# Patient Record
Sex: Female | Born: 1935
Health system: Southern US, Community
[De-identification: ages and names within clinical notes are randomized; demographics above are authoritative.]

## PROBLEM LIST (undated history)

## (undated) DIAGNOSIS — M1990 Unspecified osteoarthritis, unspecified site: Secondary | ICD-10-CM

## (undated) DIAGNOSIS — R112 Nausea with vomiting, unspecified: Secondary | ICD-10-CM

## (undated) DIAGNOSIS — R079 Chest pain, unspecified: Secondary | ICD-10-CM

## (undated) DIAGNOSIS — E785 Hyperlipidemia, unspecified: Secondary | ICD-10-CM

## (undated) DIAGNOSIS — M810 Age-related osteoporosis without current pathological fracture: Secondary | ICD-10-CM

## (undated) DIAGNOSIS — R42 Dizziness and giddiness: Secondary | ICD-10-CM

## (undated) DIAGNOSIS — N390 Urinary tract infection, site not specified: Secondary | ICD-10-CM

## (undated) DIAGNOSIS — A0472 Enterocolitis due to Clostridium difficile, not specified as recurrent: Secondary | ICD-10-CM

## (undated) DIAGNOSIS — I10 Essential (primary) hypertension: Secondary | ICD-10-CM

## (undated) DIAGNOSIS — J189 Pneumonia, unspecified organism: Secondary | ICD-10-CM

## (undated) DIAGNOSIS — Z9889 Other specified postprocedural states: Secondary | ICD-10-CM

## (undated) DIAGNOSIS — K219 Gastro-esophageal reflux disease without esophagitis: Secondary | ICD-10-CM

## (undated) DIAGNOSIS — K760 Fatty (change of) liver, not elsewhere classified: Secondary | ICD-10-CM

## (undated) HISTORY — PX: BREAST LUMPECTOMY: SHX2

## (undated) HISTORY — PX: COLONOSCOPY W/ POLYPECTOMY: SHX1380

## (undated) HISTORY — PX: EYE SURGERY: SHX253

## (undated) HISTORY — DX: Essential (primary) hypertension: I10

## (undated) HISTORY — PX: VESICOVAGINAL FISTULA CLOSURE W/ TAH: SUR271

## (undated) HISTORY — DX: Chest pain, unspecified: R07.9

## (undated) HISTORY — DX: Gastro-esophageal reflux disease without esophagitis: K21.9

## (undated) HISTORY — DX: Dizziness and giddiness: R42

## (undated) HISTORY — PX: OTHER SURGICAL HISTORY: SHX169

## (undated) HISTORY — DX: Age-related osteoporosis without current pathological fracture: M81.0

## (undated) HISTORY — DX: Hyperlipidemia, unspecified: E78.5

## (undated) HISTORY — DX: Fatty (change of) liver, not elsewhere classified: K76.0

---

## 2013-06-20 DIAGNOSIS — R079 Chest pain, unspecified: Secondary | ICD-10-CM

## 2013-06-20 HISTORY — DX: Chest pain, unspecified: R07.9

## 2014-06-20 DIAGNOSIS — J189 Pneumonia, unspecified organism: Secondary | ICD-10-CM

## 2014-06-20 HISTORY — DX: Pneumonia, unspecified organism: J18.9

## 2014-07-07 DIAGNOSIS — Z1231 Encounter for screening mammogram for malignant neoplasm of breast: Secondary | ICD-10-CM | POA: Diagnosis not present

## 2014-07-10 DIAGNOSIS — I10 Essential (primary) hypertension: Secondary | ICD-10-CM | POA: Diagnosis not present

## 2014-07-14 DIAGNOSIS — H2512 Age-related nuclear cataract, left eye: Secondary | ICD-10-CM | POA: Diagnosis not present

## 2014-07-14 DIAGNOSIS — I10 Essential (primary) hypertension: Secondary | ICD-10-CM | POA: Diagnosis not present

## 2014-07-14 DIAGNOSIS — Z961 Presence of intraocular lens: Secondary | ICD-10-CM | POA: Diagnosis not present

## 2014-07-14 DIAGNOSIS — H52223 Regular astigmatism, bilateral: Secondary | ICD-10-CM | POA: Diagnosis not present

## 2014-07-14 DIAGNOSIS — H25812 Combined forms of age-related cataract, left eye: Secondary | ICD-10-CM | POA: Diagnosis not present

## 2014-07-14 DIAGNOSIS — H5203 Hypermetropia, bilateral: Secondary | ICD-10-CM | POA: Diagnosis not present

## 2014-07-28 DIAGNOSIS — J90 Pleural effusion, not elsewhere classified: Secondary | ICD-10-CM | POA: Diagnosis not present

## 2014-07-28 DIAGNOSIS — J449 Chronic obstructive pulmonary disease, unspecified: Secondary | ICD-10-CM | POA: Diagnosis not present

## 2014-07-28 DIAGNOSIS — R079 Chest pain, unspecified: Secondary | ICD-10-CM | POA: Diagnosis not present

## 2014-08-05 DIAGNOSIS — R938 Abnormal findings on diagnostic imaging of other specified body structures: Secondary | ICD-10-CM | POA: Diagnosis not present

## 2014-08-05 DIAGNOSIS — R918 Other nonspecific abnormal finding of lung field: Secondary | ICD-10-CM | POA: Diagnosis not present

## 2014-08-05 DIAGNOSIS — I517 Cardiomegaly: Secondary | ICD-10-CM | POA: Diagnosis not present

## 2014-08-08 DIAGNOSIS — R938 Abnormal findings on diagnostic imaging of other specified body structures: Secondary | ICD-10-CM | POA: Diagnosis not present

## 2014-09-30 ENCOUNTER — Ambulatory Visit (INDEPENDENT_AMBULATORY_CARE_PROVIDER_SITE_OTHER): Payer: Self-pay | Admitting: Critical Care Medicine

## 2014-09-30 ENCOUNTER — Encounter: Payer: Self-pay | Admitting: *Deleted

## 2014-09-30 VITALS — BP 124/62 | HR 56 | Temp 98.8°F | Ht 61.0 in | Wt 104.0 lb

## 2014-09-30 DIAGNOSIS — R9389 Abnormal findings on diagnostic imaging of other specified body structures: Secondary | ICD-10-CM

## 2014-09-30 DIAGNOSIS — R938 Abnormal findings on diagnostic imaging of other specified body structures: Secondary | ICD-10-CM | POA: Diagnosis not present

## 2014-09-30 DIAGNOSIS — M810 Age-related osteoporosis without current pathological fracture: Secondary | ICD-10-CM | POA: Insufficient documentation

## 2014-09-30 DIAGNOSIS — J4540 Moderate persistent asthma, uncomplicated: Secondary | ICD-10-CM | POA: Diagnosis not present

## 2014-09-30 DIAGNOSIS — I7090 Unspecified atherosclerosis: Secondary | ICD-10-CM | POA: Diagnosis not present

## 2014-09-30 DIAGNOSIS — R918 Other nonspecific abnormal finding of lung field: Secondary | ICD-10-CM | POA: Diagnosis not present

## 2014-09-30 DIAGNOSIS — E785 Hyperlipidemia, unspecified: Secondary | ICD-10-CM | POA: Insufficient documentation

## 2014-09-30 DIAGNOSIS — K219 Gastro-esophageal reflux disease without esophagitis: Secondary | ICD-10-CM | POA: Insufficient documentation

## 2014-09-30 NOTE — Progress Notes (Signed)
Subjective:    Patient ID: Maria Garrett, female    DOB: 06/17/1935, 80 y.o.   MRN: [id number]  HPI Comments: Abn CT Chest ,   Pt had PNA and not cleared up:  05/2014.   CT scan 07/2014: polypoid mass 6mm in LMS.  Pt did have a lot of mucus.  Now will cough a lot.  No real chest pain.  No real wheeze.  No real dyspnea now. Symptoms are better.  Occ cough.  Now no mucus, previously had mucus 3 weeks ago  Cough This is a new problem. The current episode started more than 1 month ago. The problem has been rapidly improving. The cough is non-productive. Associated symptoms include postnasal drip. Pertinent negatives include no chest pain, chills, ear congestion, ear pain, fever, headaches, heartburn, hemoptysis, myalgias, nasal congestion, rash, rhinorrhea, sore throat, shortness of breath, sweats, weight loss or wheezing. The symptoms are aggravated by pollens. Risk factors: never smoker. Her past medical history is significant for asthma, bronchitis, environmental allergies and pneumonia. There is no history of bronchiectasis, COPD or emphysema. freq pna x 8 every fall   Past Medical History  Diagnosis Date  . Esophageal reflux   . Hypertension   . Vertigo   . Osteoporosis     T score 3.7  . Fatty liver   . Dyslipidemia   . Chest pain 2015     neg stress myoview 2015     Family History  Problem Relation Age of Onset  . Emphysema Mother   . Heart attack Father      History   Social History  . Marital Status: N/A    Spouse Name: N/A  . Number of Children: N/A  . Years of Education: N/A   Occupational History  . Retired     Loss adjuster, charteredDirector of Daycare   Social History Main Topics  . Smoking status: Never Smoker   . Smokeless tobacco: Never Used  . Alcohol Use: No  . Drug Use: No  . Sexual Activity: Not on file   Other Topics Concern  . Not on file   Social History Narrative  . No narrative on file     Allergies  Allergen Reactions  . Penicillins     Rash, swelling  .  Sulfa Antibiotics     Rash, swelling     No outpatient prescriptions prior to visit.   No facility-administered medications prior to visit.       Review of Systems  Constitutional: Negative for fever, chills and weight loss.  HENT: Positive for postnasal drip. Negative for ear pain, rhinorrhea, sore throat and trouble swallowing.   Respiratory: Positive for cough. Negative for hemoptysis, shortness of breath and wheezing.   Cardiovascular: Negative for chest pain.  Gastrointestinal: Negative for heartburn.       GERD   Musculoskeletal: Negative for myalgias.  Skin: Negative for rash.  Allergic/Immunologic: Positive for environmental allergies.  Neurological: Negative for headaches.       Objective:   Physical Exam Filed Vitals:   09/30/14 1154  BP: 124/62  Pulse: 56  Temp: 98.8 F (37.1 C)  TempSrc: Oral  Height: 5\' 1"  (1.549 m)  Weight: 104 lb (47.174 kg)  SpO2: 98%    Gen: Pleasant, well-nourished, in no distress,  normal affect  ENT: No lesions,  mouth clear,  oropharynx clear, no postnasal drip  Neck: No JVD, no TMG, no carotid bruits  Lungs: No use of accessory muscles, no dullness to percussion, clear  without rales or rhonchi  Cardiovascular: RRR, heart sounds normal, no murmur or gallops, no peripheral edema  Abdomen: soft and NT, no HSM,  BS normal  Musculoskeletal: No deformities, no cyanosis or clubbing  Neuro: alert, non focal  Skin: Warm, no lesions or rashes  No results found.   07/2014 CT Chest reviewed     Assessment & Plan:   Abnormal CT scan of lung Abn on CT chest suspect d/t mucus. Doubt CA,  Plan Repeat CT chest, if still present needs FOB PFTs No med changes for now     Updated Medication List Outpatient Encounter Prescriptions as of 09/30/2014  Medication Sig  . amLODipine (NORVASC) 5 MG tablet Take 1 tablet by mouth daily.  Marland Kitchen BIOTIN PO Take 2 capsules by mouth daily.  . calcium carbonate (TUMS - DOSED IN MG  ELEMENTAL CALCIUM) 500 MG chewable tablet Chew 1 tablet by mouth as needed for indigestion or heartburn.  . losartan (COZAAR) 25 MG tablet Take 25 mg by mouth daily.  . nitroGLYCERIN (NITROSTAT) 0.4 MG SL tablet Place 0.4 mg under the tongue every 5 (five) minutes as needed for chest pain.  . promethazine-dextromethorphan (PROMETHAZINE-DM) 6.25-15 MG/5ML syrup Take 5 mLs by mouth every 6 (six) hours as needed for cough.  . traZODone (DESYREL) 50 MG tablet Take 50 mg by mouth at bedtime as needed for sleep.

## 2014-09-30 NOTE — Patient Instructions (Signed)
Repeat CT of chest will be obtained Lung function tests will be obtained Labs for immune testing will be obtained No medications changes as of now Return 1 month

## 2014-10-01 DIAGNOSIS — R918 Other nonspecific abnormal finding of lung field: Secondary | ICD-10-CM | POA: Insufficient documentation

## 2014-10-01 NOTE — Assessment & Plan Note (Signed)
Abn on CT chest suspect d/t mucus. Doubt CA,  Plan Repeat CT chest, if still present needs FOB PFTs No med changes for now

## 2014-10-02 ENCOUNTER — Telehealth: Payer: Self-pay | Admitting: Critical Care Medicine

## 2014-10-02 DIAGNOSIS — J4540 Moderate persistent asthma, uncomplicated: Secondary | ICD-10-CM | POA: Diagnosis not present

## 2014-10-02 NOTE — Telephone Encounter (Signed)
Polyp still present Left main stem airway Will ask dr byrum for an FOB Pt aware

## 2014-10-28 ENCOUNTER — Telehealth: Payer: Self-pay | Admitting: Critical Care Medicine

## 2014-10-28 DIAGNOSIS — R918 Other nonspecific abnormal finding of lung field: Secondary | ICD-10-CM

## 2014-10-28 NOTE — Telephone Encounter (Signed)
Dr byrum has not yet called the pt to schedule the bronch Pt told results on pfts and labs  pls work with libby to schedule the bronch

## 2014-10-28 NOTE — Telephone Encounter (Signed)
Spoke with RB.  He will need to schedule reg bronch with no fluro; will need snares.   Lillia AbedLindsay will assist with scheduling to work with RB's schedule. Thank you.

## 2014-10-28 NOTE — Telephone Encounter (Signed)
RB wants to speak to a surgeon before he decides what kind of procedure to do. Will route message to CJ.

## 2014-10-28 NOTE — Telephone Encounter (Signed)
lmtcb x1 with respiratory to get this scheduled.  RB would like this scheduled on 10/30/14 before he goes in to 3pm Elink.

## 2014-10-30 ENCOUNTER — Ambulatory Visit (HOSPITAL_COMMUNITY): Admission: RE | Admit: 2014-10-30 | Payer: Self-pay | Source: Ambulatory Visit | Admitting: Emergency Medicine

## 2014-10-30 ENCOUNTER — Encounter (HOSPITAL_COMMUNITY): Admission: RE | Payer: Self-pay | Source: Ambulatory Visit

## 2014-10-30 ENCOUNTER — Encounter (HOSPITAL_COMMUNITY): Payer: Self-pay

## 2014-10-30 SURGERY — VIDEO BRONCHOSCOPY WITHOUT FLUORO
Anesthesia: Moderate Sedation | Laterality: Bilateral

## 2014-10-31 NOTE — Telephone Encounter (Signed)
We can check with Medical Records to see if they have report to scan in.  If not, can call [facility] for report.

## 2014-10-31 NOTE — Telephone Encounter (Signed)
I do not see a CT in pt chart and there is nothing scanned in. Checked Dr Florene RouteWrights scan folder and it is not in there either.  Not sure if they are in the basement in the process of being scanned into the chart? Dr Delford FieldWright notes that the patient had a CT done 07/2014 and will need a repeat - he had to have seen these results during office visit but there is nothing yet scanned in the chart.   Called pt to see if she has a copy of report - was this done at [facility].  [place]oke with Arline Aspindy [facility] aware that we are trying to track these results down.  Will send to Crystal as FYI that we are trying to locate results- if you come across these please call Arline AspCindy with [facility] back - these will need to be faxed to her @ (fax) [phone]

## 2014-10-31 NOTE — Telephone Encounter (Signed)
Spoke with Marcelino DusterMichelle at [facility] in medical records. She is going to fax the report over to front desk fax. Will await fax.

## 2014-10-31 NOTE — Telephone Encounter (Signed)
Cindy from [facility] calling for copy of abnormal CT, unable to find in [place], [phone]

## 2014-11-03 NOTE — Telephone Encounter (Signed)
Noted and thank you

## 2014-11-03 NOTE — Telephone Encounter (Signed)
CT Chest results from 09/30/14 done at [facility] received and given to RaymondLindsay.

## 2014-11-03 NOTE — Telephone Encounter (Signed)
Maria Garrett has been given the CT report. He has discussed this case with Dr. Joya GaskinsHendricksen. Pt has upcoming appointment with Dr. Joya GaskinsHendricksen. Maria Garrett and Dr. Joya GaskinsHendricksen are going to be doing the bronch together in the OR when the time comes. The bronch is going to be more complicated then Maria Garrett originally thought.

## 2014-11-04 ENCOUNTER — Ambulatory Visit: Payer: Self-pay | Admitting: Critical Care Medicine

## 2014-11-05 ENCOUNTER — Institutional Professional Consult (permissible substitution) (INDEPENDENT_AMBULATORY_CARE_PROVIDER_SITE_OTHER): Payer: Commercial Managed Care - HMO | Admitting: Thoracic Surgery (Cardiothoracic Vascular Surgery)

## 2014-11-05 ENCOUNTER — Encounter: Payer: Self-pay | Admitting: Thoracic Surgery (Cardiothoracic Vascular Surgery)

## 2014-11-05 VITALS — BP 183/84 | HR 64 | Resp 16 | Ht 61.0 in | Wt 104.0 lb

## 2014-11-05 DIAGNOSIS — R911 Solitary pulmonary nodule: Secondary | ICD-10-CM

## 2014-11-05 NOTE — Progress Notes (Signed)
PCP is PROCHNAU,CAROLINE, MD Referring Provider is Storm FriskWright, Patrick E, MD/ Levy PupaByrum, Robert, MD  Chief Complaint  Patient presents with  . Lung Lesion    CT CHEST @[place]   08/01/14 and 09/30/14    HPI: 80 yo woman sent for consultation re: a left main stem bronchial nodule.   Maria Garrett is a 80 yo lifelong non-smoker with a history of reflux, hypertension and vertigo. She had pneumonia in January. Her symptoms improved with antibiotics. But by mid February she was having a frequent cough with pleuritic CP. A CXR on 07/28/14 showed subsegmental atelectasis in the RLL. A chest CT was done on 08/05/14. It showed the area in question on the chest x-ray was epicardial fat. However, it did reveal a small polypoid nodule in the left main stem bronchus. There was a question as to whether this was just mucous. A repeat CT in April showed the same nodule.  She is a life-long nonsmoker. She denies CP or SOB outside of her acute illness earlier this year. Those have resolved. She does have reflux. No headaches or visual changes. Denies change in appetite, weight loss.  Zubrod Score: At the time of surgery this patient's most appropriate activity status/level should be described as: [x]     0    Normal activity, no symptoms []     1    Restricted in physical strenuous activity but ambulatory, able to do out light work []     2    Ambulatory and capable of self care, unable to do work activities, up and about >50 % of waking hours                              []     3    Only limited self care, in bed greater than 50% of waking hours []     4    Completely disabled, no self care, confined to bed or chair []     5    Moribund    Past Medical History  Diagnosis Date  . Esophageal reflux   . Hypertension   . Vertigo   . Osteoporosis     T score 3.7  . Fatty liver   . Dyslipidemia   . Chest pain 2015     neg stress myoview 2015    Past Surgical History  Procedure Laterality Date  . Vesicovaginal fistula  closure w/ tah    . Right arm surgery    . Left wrist surgery      Family History  Problem Relation Age of Onset  . Emphysema Mother   . Heart attack Father     Social History History  Substance Use Topics  . Smoking status: Never Smoker   . Smokeless tobacco: Never Used  . Alcohol Use: No    Current Outpatient Prescriptions  Medication Sig Dispense Refill  . amLODipine (NORVASC) 5 MG tablet Take 1 tablet by mouth daily.    Marland Kitchen. BIOTIN PO Take 2 capsules by mouth daily.    . calcium carbonate (TUMS - DOSED IN MG ELEMENTAL CALCIUM) 500 MG chewable tablet Chew 1 tablet by mouth as needed for indigestion or heartburn.    . losartan (COZAAR) 25 MG tablet Take 25 mg by mouth daily.    . nitroGLYCERIN (NITROSTAT) 0.4 MG SL tablet Place 0.4 mg under the tongue every 5 (five) minutes as needed for chest pain.    . promethazine-dextromethorphan (PROMETHAZINE-DM) 6.25-15 MG/5ML syrup Take  5 mLs by mouth every 6 (six) hours as needed for cough.    . traZODone (DESYREL) 50 MG tablet Take 50 mg by mouth at bedtime as needed for sleep.     No current facility-administered medications for this visit.    Allergies  Allergen Reactions  . Penicillins     Rash, swelling  . Sulfa Antibiotics     Rash, swelling    Review of Systems  Constitutional: Negative for fever, chills, activity change, appetite change, fatigue and unexpected weight change.  HENT: Positive for hearing loss.   Respiratory: Positive for cough.        Pleuritic pain in February  Cardiovascular: Negative for chest pain and leg swelling.  Gastrointestinal:       Reflux  Genitourinary: Negative.   Musculoskeletal: Positive for arthralgias.       Stiff neck  Hematological: Bruises/bleeds easily.  All other systems reviewed and are negative.   BP 183/84 mmHg  Pulse 64  Resp 16  Ht 5\' 1"  (1.549 m)  Wt 104 lb (47.174 kg)  BMI 19.66 kg/m2  SpO2 99% Physical Exam  Constitutional: She is oriented to person, place, and  time. She appears well-developed and well-nourished. No distress.  HENT:  Head: Normocephalic and atraumatic.  Eyes: EOM are normal. Pupils are equal, round, and reactive to light.  Neck: Neck supple. No thyromegaly present.  Cardiovascular: Normal rate, regular rhythm, normal heart sounds and intact distal pulses.  Exam reveals no gallop and no friction rub.   No murmur heard. Pulmonary/Chest: Effort normal and breath sounds normal. She has no wheezes. She has no rales.  Abdominal: Soft. She exhibits no mass. There is no tenderness.  Musculoskeletal: Normal range of motion. She exhibits no edema.  Lymphadenopathy:    She has no cervical adenopathy.  Neurological: She is alert and oriented to person, place, and time. No cranial nerve deficit.  No focal motor deficits  Skin: Skin is warm and dry.  Psychiatric: She has a normal mood and affect.  Vitals reviewed.    Diagnostic Tests: [facility] and 09/30/14 reviewed.   They show a 6 mm polypoid lesion in the distal left main stem bronchus. The nodule is anterior at the left upper lobe bronchus origin  Impression: 80 yo woman with a left main stem bronchial nodule. The differential includes both malignant and benign tumors. Carcinoid is the most likely diagnosis, but squamous cell carcinoma is a possibility. She does have second hand smoke exposure.  I recommended that we proceed with flexible fiberoptic bronchoscopy in the OR under general anesthesia to biopsy/ remove the mass. We would do this under general in case rigid bronch or laser is necessary. I described the procedure to Maria Garrett and her husband. I reviewed the general nature of the procedure, the need for general anesthesia, and that we would do this as an outpatient. They understand risks include, but are not limited to death, MI, stroke, bleeding, possible need for transfusion, airway perforation, pneumothorax, recurrence of the tumor.  She accepts  the risks and agrees to proceed.  Plan: Flexible fiberoptic bronchoscopy, possible laser, possible rigid bronchoscopy on Wed. 11/26/14  Maria SlotSteven C Stanly Si, MD [facility] [phone]

## 2014-11-06 ENCOUNTER — Other Ambulatory Visit: Payer: Self-pay | Admitting: *Deleted

## 2014-11-06 ENCOUNTER — Encounter: Payer: Self-pay | Admitting: Critical Care Medicine

## 2014-11-06 DIAGNOSIS — R911 Solitary pulmonary nodule: Secondary | ICD-10-CM

## 2014-11-19 NOTE — Pre-Procedure Instructions (Signed)
Gigi Gineggy D Kohen  11/19/2014    [facility] - [place], [place] - [address] [place] [address] Phone: [phone] Fax: [phone]  [facility] [address] [place], [place] - [id number] [place] [id number] Phone: [phone] Fax: [phone]    Your procedure is scheduled on: Wednesday November 26, 2014 at 8:30 AM.  [facility] at 6:30 A.M.  Call this number if you have problems the morning of surgery: [phone]    Remember:  Do not eat food or drink liquids after midnight.  Take these medicines the morning of surgery with A SIP OF WATER: Amlodipine (Norvasc)   Please stop taking any vitamins, herbal medications, biotin, Ibuprofen, Advil, Motrin, etc   Do not wear jewelry, make-up or nail polish.  Do not wear lotions, powders, or perfumes.  You may NOT wear deodorant.  Do not shave 48 hours prior to surgery.    Do not bring valuables to the hospital.  [facility] is not responsible for any belongings or valuables.  Contacts, dentures or bridgework may not be worn into surgery.  Leave your suitcase in the car.  After surgery it may be brought to your room.  For patients admitted to the hospital, discharge time will be determined by your treatment team.  Patients discharged the day of surgery will not be allowed to drive home.   Name and phone number of your driver:    Special instructions: Shower using CHG soap the night before and the morning of your surgery  Please read over the following fact sheets that you were given. Pain Booklet, Coughing and Deep Breathing and Surgical Site Infection Prevention

## 2014-11-20 ENCOUNTER — Encounter (HOSPITAL_COMMUNITY): Payer: Self-pay

## 2014-11-20 ENCOUNTER — Encounter (HOSPITAL_COMMUNITY)
Admission: RE | Admit: 2014-11-20 | Discharge: 2014-11-20 | Disposition: A | Discharge status: Home / Self Care | Payer: Commercial Managed Care - HMO | Source: Ambulatory Visit | Attending: Thoracic Surgery (Cardiothoracic Vascular Surgery) | Admitting: Thoracic Surgery (Cardiothoracic Vascular Surgery)

## 2014-11-20 ENCOUNTER — Other Ambulatory Visit: Payer: Self-pay

## 2014-11-20 VITALS — BP 138/55 | HR 58 | Temp 98.7°F | Resp 16 | Ht 61.0 in | Wt 106.3 lb

## 2014-11-20 DIAGNOSIS — J9809 Other diseases of bronchus, not elsewhere classified: Secondary | ICD-10-CM | POA: Diagnosis not present

## 2014-11-20 DIAGNOSIS — R911 Solitary pulmonary nodule: Secondary | ICD-10-CM

## 2014-11-20 DIAGNOSIS — Z01812 Encounter for preprocedural laboratory examination: Secondary | ICD-10-CM | POA: Diagnosis not present

## 2014-11-20 DIAGNOSIS — R001 Bradycardia, unspecified: Secondary | ICD-10-CM | POA: Diagnosis not present

## 2014-11-20 HISTORY — DX: Other specified postprocedural states: R11.2

## 2014-11-20 HISTORY — DX: Enterocolitis due to Clostridium difficile, not specified as recurrent: A04.72

## 2014-11-20 HISTORY — DX: Other specified postprocedural states: Z98.890

## 2014-11-20 HISTORY — DX: Unspecified osteoarthritis, unspecified site: M19.90

## 2014-11-20 HISTORY — DX: Pneumonia, unspecified organism: J18.9

## 2014-11-20 HISTORY — DX: Urinary tract infection, site not specified: N39.0

## 2014-11-20 LAB — COMPREHENSIVE METABOLIC PANEL
ALK PHOS: 55 U/L (ref 38–126)
ALT: 22 U/L (ref 14–54)
AST: 25 U/L (ref 15–41)
Albumin: 4.1 g/dL (ref 3.5–5.0)
Anion gap: 9 (ref 5–15)
BUN: 10 mg/dL (ref 6–20)
CALCIUM: 9.4 mg/dL (ref 8.9–10.3)
CO2: 27 mmol/L (ref 22–32)
CREATININE: 0.87 mg/dL (ref 0.44–1.00)
Chloride: 106 mmol/L (ref 101–111)
GLUCOSE: 93 mg/dL (ref 65–99)
Potassium: 3.8 mmol/L (ref 3.5–5.1)
SODIUM: 142 mmol/L (ref 135–145)
Total Bilirubin: 0.4 mg/dL (ref 0.3–1.2)
Total Protein: 7 g/dL (ref 6.5–8.1)

## 2014-11-20 LAB — CBC
HEMATOCRIT: 38.5 % (ref 36.0–46.0)
Hemoglobin: 13.3 g/dL (ref 12.0–15.0)
MCH: 30.2 pg (ref 26.0–34.0)
MCHC: 34.5 g/dL (ref 30.0–36.0)
MCV: 87.5 fL (ref 78.0–100.0)
Platelets: 253 10*3/uL (ref 150–400)
RBC: 4.4 MIL/uL (ref 3.87–5.11)
RDW: 12.2 % (ref 11.5–15.5)
WBC: 6.6 10*3/uL (ref 4.0–10.5)

## 2014-11-20 LAB — PROTIME-INR
INR: 1.08 (ref 0.00–1.49)
Prothrombin Time: 14.2 seconds (ref 11.6–15.2)

## 2014-11-20 LAB — APTT: aPTT: 29 seconds (ref 24–37)

## 2014-11-20 NOTE — Progress Notes (Signed)
PCP is Philemon Kingdomaroline Prochnau and Pulmonologist is Shan Levansatrick Wright. Patient denied having any acute cardiac or pulmonary issues. Patient informed Nurse that she had a stress test [facility] within the last five years. Will request records. Husband at chair side during PAT visit.

## 2014-11-24 ENCOUNTER — Encounter: Payer: Self-pay | Admitting: Critical Care Medicine

## 2014-11-25 ENCOUNTER — Ambulatory Visit: Payer: Self-pay | Admitting: Critical Care Medicine

## 2014-11-25 NOTE — Progress Notes (Signed)
Spoke with [facility] and they will fax us the stress test from 2012 today

## 2014-11-26 ENCOUNTER — Encounter (HOSPITAL_COMMUNITY): Payer: Self-pay | Admitting: *Deleted

## 2014-11-26 ENCOUNTER — Ambulatory Visit (HOSPITAL_COMMUNITY)
Admission: RE | Admit: 2014-11-26 | Discharge: 2014-11-26 | Disposition: A | Discharge status: Home / Self Care | Payer: Commercial Managed Care - HMO | Source: Ambulatory Visit | Attending: Thoracic Surgery (Cardiothoracic Vascular Surgery) | Admitting: Thoracic Surgery (Cardiothoracic Vascular Surgery)

## 2014-11-26 ENCOUNTER — Ambulatory Visit (HOSPITAL_COMMUNITY): Payer: Commercial Managed Care - HMO

## 2014-11-26 ENCOUNTER — Ambulatory Visit (HOSPITAL_COMMUNITY): Payer: Commercial Managed Care - HMO | Admitting: Anesthesiology

## 2014-11-26 ENCOUNTER — Encounter (HOSPITAL_COMMUNITY)
Admission: RE | Disposition: A | Discharge status: Home / Self Care | Payer: Self-pay | Source: Ambulatory Visit | Attending: Thoracic Surgery (Cardiothoracic Vascular Surgery)

## 2014-11-26 DIAGNOSIS — E785 Hyperlipidemia, unspecified: Secondary | ICD-10-CM | POA: Insufficient documentation

## 2014-11-26 DIAGNOSIS — K760 Fatty (change of) liver, not elsewhere classified: Secondary | ICD-10-CM | POA: Diagnosis not present

## 2014-11-26 DIAGNOSIS — I10 Essential (primary) hypertension: Secondary | ICD-10-CM | POA: Insufficient documentation

## 2014-11-26 DIAGNOSIS — M1990 Unspecified osteoarthritis, unspecified site: Secondary | ICD-10-CM | POA: Diagnosis not present

## 2014-11-26 DIAGNOSIS — Z880 Allergy status to penicillin: Secondary | ICD-10-CM | POA: Diagnosis not present

## 2014-11-26 DIAGNOSIS — J9809 Other diseases of bronchus, not elsewhere classified: Secondary | ICD-10-CM | POA: Diagnosis not present

## 2014-11-26 DIAGNOSIS — Z882 Allergy status to sulfonamides status: Secondary | ICD-10-CM | POA: Diagnosis not present

## 2014-11-26 DIAGNOSIS — R911 Solitary pulmonary nodule: Secondary | ICD-10-CM | POA: Insufficient documentation

## 2014-11-26 DIAGNOSIS — J449 Chronic obstructive pulmonary disease, unspecified: Secondary | ICD-10-CM | POA: Diagnosis not present

## 2014-11-26 DIAGNOSIS — K219 Gastro-esophageal reflux disease without esophagitis: Secondary | ICD-10-CM | POA: Insufficient documentation

## 2014-11-26 DIAGNOSIS — Z01811 Encounter for preprocedural respiratory examination: Secondary | ICD-10-CM

## 2014-11-26 DIAGNOSIS — M810 Age-related osteoporosis without current pathological fracture: Secondary | ICD-10-CM | POA: Diagnosis not present

## 2014-11-26 DIAGNOSIS — Z79899 Other long term (current) drug therapy: Secondary | ICD-10-CM | POA: Diagnosis not present

## 2014-11-26 HISTORY — PX: VIDEO BRONCHOSCOPY: SHX5072

## 2014-11-26 SURGERY — BRONCHOSCOPY, VIDEO-ASSISTED
Anesthesia: General | Site: Bronchus

## 2014-11-26 MED ORDER — DEXAMETHASONE SODIUM PHOSPHATE 10 MG/ML IJ SOLN
INTRAMUSCULAR | Status: DC | PRN
  Administered 2014-11-26: 4 mg via INTRAVENOUS

## 2014-11-26 MED ORDER — ROCURONIUM BROMIDE 50 MG/5ML IV SOLN
INTRAVENOUS | Status: AC
  Filled 2014-11-26: qty 1

## 2014-11-26 MED ORDER — PROPOFOL 10 MG/ML IV BOLUS
INTRAVENOUS | Status: DC | PRN
  Administered 2014-11-26: 120 mg via INTRAVENOUS

## 2014-11-26 MED ORDER — NEOSTIGMINE METHYLSULFATE 10 MG/10ML IV SOLN
INTRAVENOUS | Status: DC | PRN
  Administered 2014-11-26: 3 mg via INTRAVENOUS

## 2014-11-26 MED ORDER — EPINEPHRINE HCL 1 MG/ML IJ SOLN
INTRAMUSCULAR | Status: DC | PRN
  Administered 2014-11-26: 1 mg via ENDOTRACHEOPULMONARY

## 2014-11-26 MED ORDER — 0.9 % SODIUM CHLORIDE (POUR BTL) OPTIME
TOPICAL | Status: DC | PRN
  Administered 2014-11-26: 1000 mL

## 2014-11-26 MED ORDER — FENTANYL CITRATE (PF) 100 MCG/2ML IJ SOLN
INTRAMUSCULAR | Status: DC | PRN
  Administered 2014-11-26 (×2): 50 ug via INTRAVENOUS

## 2014-11-26 MED ORDER — ROCURONIUM BROMIDE 100 MG/10ML IV SOLN
INTRAVENOUS | Status: DC | PRN
  Administered 2014-11-26: 15 mg via INTRAVENOUS
  Administered 2014-11-26: 25.5 mg via INTRAVENOUS

## 2014-11-26 MED ORDER — LACTATED RINGERS IV SOLN
INTRAVENOUS | Status: DC | PRN
  Administered 2014-11-26: 08:00:00 via INTRAVENOUS

## 2014-11-26 MED ORDER — PROPOFOL 10 MG/ML IV BOLUS
INTRAVENOUS | Status: AC
  Filled 2014-11-26: qty 20

## 2014-11-26 MED ORDER — MIDAZOLAM HCL 2 MG/2ML IJ SOLN
INTRAMUSCULAR | Status: AC
  Filled 2014-11-26: qty 2

## 2014-11-26 MED ORDER — GLYCOPYRROLATE 0.2 MG/ML IJ SOLN
INTRAMUSCULAR | Status: DC | PRN
  Administered 2014-11-26: 0.6 mg via INTRAVENOUS

## 2014-11-26 MED ORDER — EPHEDRINE SULFATE 50 MG/ML IJ SOLN
INTRAMUSCULAR | Status: AC
  Filled 2014-11-26: qty 1

## 2014-11-26 MED ORDER — SCOPOLAMINE 1 MG/3DAYS TD PT72
MEDICATED_PATCH | TRANSDERMAL | Status: AC
  Administered 2014-11-26: 1 via TRANSDERMAL
  Filled 2014-11-26: qty 1

## 2014-11-26 MED ORDER — LIDOCAINE HCL (CARDIAC) 20 MG/ML IV SOLN
INTRAVENOUS | Status: DC | PRN
  Administered 2014-11-26: 100 mg via INTRAVENOUS

## 2014-11-26 MED ORDER — MIDAZOLAM HCL 5 MG/5ML IJ SOLN
INTRAMUSCULAR | Status: DC | PRN
  Administered 2014-11-26: 2 mg via INTRAVENOUS

## 2014-11-26 MED ORDER — FENTANYL CITRATE (PF) 250 MCG/5ML IJ SOLN
INTRAMUSCULAR | Status: AC
  Filled 2014-11-26: qty 5

## 2014-11-26 MED ORDER — SODIUM CHLORIDE 0.9 % IJ SOLN
INTRAMUSCULAR | Status: AC
  Filled 2014-11-26: qty 10

## 2014-11-26 MED ORDER — EPINEPHRINE HCL 1 MG/ML IJ SOLN
INTRAMUSCULAR | Status: AC
  Filled 2014-11-26: qty 1

## 2014-11-26 MED ORDER — EPHEDRINE SULFATE 50 MG/ML IJ SOLN
INTRAMUSCULAR | Status: DC | PRN
  Administered 2014-11-26: 10 mg via INTRAVENOUS

## 2014-11-26 MED ORDER — LIDOCAINE HCL (CARDIAC) 20 MG/ML IV SOLN
INTRAVENOUS | Status: AC
  Filled 2014-11-26: qty 10

## 2014-11-26 MED ORDER — ONDANSETRON HCL 4 MG/2ML IJ SOLN
INTRAMUSCULAR | Status: DC | PRN
  Administered 2014-11-26: 4 mg via INTRAVENOUS

## 2014-11-26 SURGICAL SUPPLY — 49 items
ADAPTER CATH SYR TO TUBING 38M (ADAPTER) ×4 IMPLANT
BLOCK BITE 60FR ADLT L/F BLUE (MISCELLANEOUS) ×4 IMPLANT
BNDG GAUZE ELAST 4 BULKY (GAUZE/BANDAGES/DRESSINGS) IMPLANT
BRUSH CYTOL CELLEBRITY 1.5X140 (MISCELLANEOUS) IMPLANT
CANISTER SUCTION 2500CC (MISCELLANEOUS) ×4 IMPLANT
CONT SPEC 4OZ CLIKSEAL STRL BL (MISCELLANEOUS) ×4 IMPLANT
COTTONBALL LRG STERILE PKG (GAUZE/BANDAGES/DRESSINGS) IMPLANT
COVER TABLE BACK 60X90 (DRAPES) ×4 IMPLANT
DRAPE INCISE IOBAN 66X45 STRL (DRAPES) IMPLANT
FILTER STRAW FLUID ASPIR (MISCELLANEOUS) IMPLANT
FORCEPS BIOP RJ4 1.8 (CUTTING FORCEPS) IMPLANT
FORCEPS RADIAL JAW LRG 4 PULM (INSTRUMENTS) IMPLANT
GAS CARTRIDGE  LASER (MISCELLANEOUS) ×4 IMPLANT
GAUZE SPONGE 4X4 12PLY STRL (GAUZE/BANDAGES/DRESSINGS) ×4 IMPLANT
GAUZE VASELINE FOILPK 1/2 X 72 (GAUZE/BANDAGES/DRESSINGS) IMPLANT
GLOVE SURG SIGNA 7.5 PF LTX (GLOVE) ×4 IMPLANT
GOWN STRL REUS W/ TWL LRG LVL3 (GOWN DISPOSABLE) ×2 IMPLANT
GOWN STRL REUS W/ TWL XL LVL3 (GOWN DISPOSABLE) ×2 IMPLANT
GOWN STRL REUS W/TWL LRG LVL3 (GOWN DISPOSABLE) ×2
GOWN STRL REUS W/TWL XL LVL3 (GOWN DISPOSABLE) ×2
GUARD TEETH (MISCELLANEOUS) IMPLANT
KIT BASIN OR (CUSTOM PROCEDURE TRAY) ×4 IMPLANT
KIT CLEAN ENDO COMPLIANCE (KITS) ×4 IMPLANT
KIT ROOM TURNOVER OR (KITS) ×4 IMPLANT
LASER FIBER FLEXIBLE (MISCELLANEOUS) ×4 IMPLANT
NEEDLE 22X1 1/2 (OR ONLY) (NEEDLE) IMPLANT
NEEDLE BIOPSY TRANSBRONCH 21G (NEEDLE) ×4 IMPLANT
NEEDLE BLUNT 16X1.5 OR ONLY (NEEDLE) ×4 IMPLANT
NEEDLE SUPERTRX PREMARK BIOPSY (NEEDLE) ×4 IMPLANT
NS IRRIG 1000ML POUR BTL (IV SOLUTION) ×4 IMPLANT
PAD ARMBOARD 7.5X6 YLW CONV (MISCELLANEOUS) ×8 IMPLANT
PAD EYE OVAL STERILE LF (GAUZE/BANDAGES/DRESSINGS) IMPLANT
RADIAL JAW LRG 4 PULMONARY (INSTRUMENTS)
SNARE SHORT THROW 13M SML OVAL (MISCELLANEOUS) IMPLANT
SOLUTION ANTI FOG 6CC (MISCELLANEOUS) ×4 IMPLANT
SYR 20ML ECCENTRIC (SYRINGE) ×8 IMPLANT
SYR 5ML LL (SYRINGE) ×4 IMPLANT
SYR 5ML LUER SLIP (SYRINGE) ×4 IMPLANT
SYR BULB IRRIGATION 50ML (SYRINGE) ×4 IMPLANT
SYR CONTROL 10ML LL (SYRINGE) IMPLANT
SYR TOOMEY 50ML (SYRINGE) IMPLANT
TOWEL OR 17X24 6PK STRL BLUE (TOWEL DISPOSABLE) ×4 IMPLANT
TOWEL OR 17X26 10 PK STRL BLUE (TOWEL DISPOSABLE) ×4 IMPLANT
TRAP SPECIMEN MUCOUS 40CC (MISCELLANEOUS) ×4 IMPLANT
TUBE CONNECTING 12'X1/4 (SUCTIONS) ×1
TUBE CONNECTING 12X1/4 (SUCTIONS) ×3 IMPLANT
TUBE CONNECTING 20'X1/4 (TUBING) ×1
TUBE CONNECTING 20X1/4 (TUBING) ×3 IMPLANT
WATER STERILE IRR 1000ML POUR (IV SOLUTION) IMPLANT

## 2014-11-26 NOTE — Anesthesia Procedure Notes (Signed)
Procedure Name: Intubation Date/Time: 11/26/2014 8:33 AM Performed by: Carmela RimaMARTINELLI, Tobe Kervin F Pre-anesthesia Checklist: Patient being monitored, Suction available, Emergency Drugs available, Patient identified and Timeout performed Patient Re-evaluated:Patient Re-evaluated prior to inductionOxygen Delivery Method: Circle system utilized Preoxygenation: Pre-oxygenation with 100% oxygen Intubation Type: IV induction Ventilation: Mask ventilation without difficulty Laryngoscope Size: Mac and 3 Grade View: Grade I Tube type: Oral Tube size: 8.5 mm Number of attempts: 1 Placement Confirmation: positive ETCO2,  ETT inserted through vocal cords under direct vision and breath sounds checked- equal and bilateral Secured at: 22 cm Tube secured with: Tape Dental Injury: Teeth and Oropharynx as per pre-operative assessment

## 2014-11-26 NOTE — Transfer of Care (Signed)
Immediate Anesthesia Transfer of Care Note  Patient: Maria Garrett  Procedure(s) Performed: Procedure(s): VIDEO BRONCHOSCOPYwith biopsies,brushings and needle aspirations left main stem endobronchial lesion  (N/A)  Patient Location: PACU  Anesthesia Type:General  Level of Consciousness: awake, alert  and oriented  Airway & Oxygen Therapy: Patient Spontanous Breathing and Patient connected to nasal cannula oxygen  Post-op Assessment: Report given to RN, Post -op Vital signs reviewed and stable and Patient moving all extremities X 4  Post vital signs: Reviewed and stable  Last Vitals:  Filed Vitals:   11/26/14 1005  BP:   Pulse: 82  Temp:   Resp:     Complications: No apparent anesthesia complications

## 2014-11-26 NOTE — Interval H&P Note (Signed)
History and Physical Interval Note:  11/26/2014 8:08 AM  Job FoundsPeggy D Garrett  has presented today for surgery, with the diagnosis of left main stem bronchus nodule  The various methods of treatment have been discussed with the patient and family. After consideration of risks, benefits and other options for treatment, the patient has consented to  Procedure(s): VIDEO BRONCHOSCOPY (N/A) POSSIBLE LASER BRONCHOSCOPY (N/A) POSSIBLE RIGID BRONCHOSCOPY (N/A) as a surgical intervention .  The patient's history has been reviewed, patient examined, no change in status, stable for surgery.  I have reviewed the patient's chart and labs.  Questions were answered to the patient's satisfaction.     Loreli SlotSteven C Jadin Kagel

## 2014-11-26 NOTE — Anesthesia Postprocedure Evaluation (Signed)
Anesthesia Post Note  Patient: Maria Garrett  Procedure(s) Performed: Procedure(s) (LRB): VIDEO BRONCHOSCOPYwith biopsies,brushings and needle aspirations left main stem endobronchial lesion  (N/A)  Anesthesia type: General  Patient location: PACU  Post pain: Pain level controlled  Post assessment: Post-op Vital signs reviewed  Last Vitals: BP 162/59 mmHg  Pulse 59  Temp(Src) 36.3 C  Resp 18  SpO2 99%  Post vital signs: Reviewed  Level of consciousness: sedated  Complications: No apparent anesthesia complications

## 2014-11-26 NOTE — Discharge Instructions (Signed)
Do not drive or engage in heavy physical activity for 24 hours  You may resume normal activities tomorrow  You may cough up small amounts of blood over the next few days  You may use over-the-counter cough medication or throat lozenges as needed  Call [phone] if you develop chest pain, shortness of breath, fever > 101, or cough up large amounts of blood  My office will contact you with a follow up appointment to discuss the biopsy results.

## 2014-11-26 NOTE — H&P (View-Only) (Signed)
PCP is PROCHNAU,CAROLINE, MD Referring Provider is Storm FriskWright, Patrick E, MD/ Levy PupaByrum, Robert, MD  Chief Complaint  Patient presents with  . Lung Lesion    CT CHEST @[place]   08/01/14 and 09/30/14    HPI: 80 yo woman sent for consultation re: a left main stem bronchial nodule.   Maria Garrett is a 80 yo lifelong non-smoker with a history of reflux, hypertension and vertigo. She had pneumonia in January. Her symptoms improved with antibiotics. But by mid February she was having a frequent cough with pleuritic CP. A CXR on 07/28/14 showed subsegmental atelectasis in the RLL. A chest CT was done on 08/05/14. It showed the area in question on the chest x-ray was epicardial fat. However, it did reveal a small polypoid nodule in the left main stem bronchus. There was a question as to whether this was just mucous. A repeat CT in April showed the same nodule.  She is a life-long nonsmoker. She denies CP or SOB outside of her acute illness earlier this year. Those have resolved. She does have reflux. No headaches or visual changes. Denies change in appetite, weight loss.  Zubrod Score: At the time of surgery this patient's most appropriate activity status/level should be described as: [x]     0    Normal activity, no symptoms []     1    Restricted in physical strenuous activity but ambulatory, able to do out light work []     2    Ambulatory and capable of self care, unable to do work activities, up and about >50 % of waking hours                              []     3    Only limited self care, in bed greater than 50% of waking hours []     4    Completely disabled, no self care, confined to bed or chair []     5    Moribund    Past Medical History  Diagnosis Date  . Esophageal reflux   . Hypertension   . Vertigo   . Osteoporosis     T score 3.7  . Fatty liver   . Dyslipidemia   . Chest pain 2015     neg stress myoview 2015    Past Surgical History  Procedure Laterality Date  . Vesicovaginal fistula  closure w/ tah    . Right arm surgery    . Left wrist surgery      Family History  Problem Relation Age of Onset  . Emphysema Mother   . Heart attack Father     Social History History  Substance Use Topics  . Smoking status: Never Smoker   . Smokeless tobacco: Never Used  . Alcohol Use: No    Current Outpatient Prescriptions  Medication Sig Dispense Refill  . amLODipine (NORVASC) 5 MG tablet Take 1 tablet by mouth daily.    Marland Kitchen. BIOTIN PO Take 2 capsules by mouth daily.    . calcium carbonate (TUMS - DOSED IN MG ELEMENTAL CALCIUM) 500 MG chewable tablet Chew 1 tablet by mouth as needed for indigestion or heartburn.    . losartan (COZAAR) 25 MG tablet Take 25 mg by mouth daily.    . nitroGLYCERIN (NITROSTAT) 0.4 MG SL tablet Place 0.4 mg under the tongue every 5 (five) minutes as needed for chest pain.    . promethazine-dextromethorphan (PROMETHAZINE-DM) 6.25-15 MG/5ML syrup Take  5 mLs by mouth every 6 (six) hours as needed for cough.    . traZODone (DESYREL) 50 MG tablet Take 50 mg by mouth at bedtime as needed for sleep.     No current facility-administered medications for this visit.    Allergies  Allergen Reactions  . Penicillins     Rash, swelling  . Sulfa Antibiotics     Rash, swelling    Review of Systems  Constitutional: Negative for fever, chills, activity change, appetite change, fatigue and unexpected weight change.  HENT: Positive for hearing loss.   Respiratory: Positive for cough.        Pleuritic pain in February  Cardiovascular: Negative for chest pain and leg swelling.  Gastrointestinal:       Reflux  Genitourinary: Negative.   Musculoskeletal: Positive for arthralgias.       Stiff neck  Hematological: Bruises/bleeds easily.  All other systems reviewed and are negative.   BP 183/84 mmHg  Pulse 64  Resp 16  Ht 5\' 1"  (1.549 m)  Wt 104 lb (47.174 kg)  BMI 19.66 kg/m2  SpO2 99% Physical Exam  Constitutional: She is oriented to person, place, and  time. She appears well-developed and well-nourished. No distress.  HENT:  Head: Normocephalic and atraumatic.  Eyes: EOM are normal. Pupils are equal, round, and reactive to light.  Neck: Neck supple. No thyromegaly present.  Cardiovascular: Normal rate, regular rhythm, normal heart sounds and intact distal pulses.  Exam reveals no gallop and no friction rub.   No murmur heard. Pulmonary/Chest: Effort normal and breath sounds normal. She has no wheezes. She has no rales.  Abdominal: Soft. She exhibits no mass. There is no tenderness.  Musculoskeletal: Normal range of motion. She exhibits no edema.  Lymphadenopathy:    She has no cervical adenopathy.  Neurological: She is alert and oriented to person, place, and time. No cranial nerve deficit.  No focal motor deficits  Skin: Skin is warm and dry.  Psychiatric: She has a normal mood and affect.  Vitals reviewed.    Diagnostic Tests: CT scans images from [facility] on 08/05/14 and 09/30/14 reviewed.   They show a 6 mm polypoid lesion in the distal left main stem bronchus. The nodule is anterior at the left upper lobe bronchus origin  Impression: 80 yo woman with a left main stem bronchial nodule. The differential includes both malignant and benign tumors. Carcinoid is the most likely diagnosis, but squamous cell carcinoma is a possibility. She does have second hand smoke exposure.  I recommended that we proceed with flexible fiberoptic bronchoscopy in the OR under general anesthesia to biopsy/ remove the mass. We would do this under general in case rigid bronch or laser is necessary. I described the procedure to [name] and her husband. I reviewed the general nature of the procedure, the need for general anesthesia, and that we would do this as an outpatient. They understand risks include, but are not limited to death, MI, stroke, bleeding, possible need for transfusion, airway perforation, pneumothorax, recurrence of the tumor.  She accepts  the risks and agrees to proceed.  Plan: Flexible fiberoptic bronchoscopy, possible laser, possible rigid bronchoscopy on Wed. 11/26/14  Maria SlotSteven C Eisa Necaise, MD [facility] [phone]

## 2014-11-26 NOTE — Brief Op Note (Addendum)
11/26/2014  10:33 AM  PATIENT:  Maria Garrett  80 y.o. female  PRE-OPERATIVE DIAGNOSIS:   left main stem bronchus nodule  POST-OPERATIVE DIAGNOSIS:   left main stem bronchus abnormality  PROCEDURE:  Procedure(s): VIDEO BRONCHOSCOPYwith biopsies,brushings and needle aspirations left main stem endobronchial lesion  (N/A)  ENDOBRONCHIAL ULTRASOUND  SURGEON:  Surgeon(s) and Role:    * Loreli SlotSteven C Tyree Fluharty, MD - Primary    * Leslye Peerobert S Byrum, MD - Consulting   ANESTHESIA:   general  EBL:  Total I/O In: 1200 [I.V.:1200] Out: -   BLOOD ADMINISTERED:none  DRAINS: none   LOCAL MEDICATIONS USED:  NONE  SPECIMEN:  Source of Specimen:  Left main stem bronchus "mass"  DISPOSITION OF SPECIMEN:  PATHOLOGY  PLAN OF CARE: Discharge to home after PACU  PATIENT DISPOSITION:  PACU - hemodynamically stable.   Delay start of Pharmacological VTE agent (>24hrs) due to surgical blood loss or risk of bleeding: not applicable  FINDINGS: 1. Narrowing in distal left main stem bronchus above the bifurcation ? Cartilaginous abnormality 2. Linear narrowing of bronchus along superior aspect at takeoff of left upper lobe bronchus- no discrete mass lesion seen  Dictation # [id number]

## 2014-11-26 NOTE — Anesthesia Preprocedure Evaluation (Addendum)
Anesthesia Evaluation  Patient identified by MRN, date of birth, ID band Patient awake    Reviewed: Allergy & Precautions, H&P , NPO status , Patient's Chart, lab work & pertinent test results  History of Anesthesia Complications (+) PONV and history of anesthetic complications  Airway Mallampati: II  TM Distance: >3 FB Neck ROM: Full    Dental no notable dental hx. (+) Dental Advidsory Given   Pulmonary pneumonia -,  breath sounds clear to auscultation  Pulmonary exam normal       Cardiovascular hypertension, Pt. on medications Normal cardiovascular examRhythm:Regular Rate:Normal     Neuro/Psych negative neurological ROS  negative psych ROS   GI/Hepatic Neg liver ROS, GERD-  Medicated,  Endo/Other  negative endocrine ROS  Renal/GU negative Renal ROS     Musculoskeletal  (+) Arthritis -,   Abdominal   Peds  Hematology negative hematology ROS (+)   Anesthesia Other Findings   Reproductive/Obstetrics negative OB ROS                            Anesthesia Physical Anesthesia Plan  ASA: III  Anesthesia Plan: General   Post-op Pain Management:    Induction: Intravenous  Airway Management Planned: Oral ETT  Additional Equipment: None  Intra-op Plan:   Post-operative Plan: Extubation in OR  Informed Consent: I have reviewed the patients History and Physical, chart, labs and discussed the procedure including the risks, benefits and alternatives for the proposed anesthesia with the patient or authorized representative who has indicated his/her understanding and acceptance.   Dental advisory given and Dental Advisory Given  Plan Discussed with: CRNA, Anesthesiologist and Surgeon  Anesthesia Plan Comments:        Anesthesia Quick Evaluation

## 2014-11-27 ENCOUNTER — Encounter (HOSPITAL_COMMUNITY): Payer: Self-pay | Admitting: Thoracic Surgery (Cardiothoracic Vascular Surgery)

## 2014-11-27 NOTE — Op Note (Signed)
NAMENICHOLETTE, DOLSON                 ACCOUNT NO.:  [id number]  MEDICAL RECORD NO.:  [id number]  LOCATION:  [facility]                         FACILITY:  [facility]  PHYSICIAN:  Salvatore Decent. Dorris Fetch, M.D.DATE OF BIRTH:  Jul 10, 1934  DATE OF PROCEDURE:  11/26/2014 DATE OF DISCHARGE:  11/26/2014                              OPERATIVE REPORT   PREOPERATIVE DIAGNOSIS:  Left mainstem bronchial nodule.  POSTOPERATIVE DIAGNOSIS:  Left main stem bronchus abnormality.  PROCEDURE:  Video bronchoscopy with biopsies, brushings, and needle aspirations.  Endobronchial ultrasound.  SURGEON:  Salvatore Decent. Dorris Fetch, M.D.  INTRAOPERATIVE CONSULTANT: Levy Pupa, MD  ANESTHESIA:  General.  FINDINGS:  No distinct mass noted in the left mainstem bronchus. Narrowing of inferior aspect of left mainstem appeared due to cartilaginous abnormality. Ridge of tissue covered with normal-appearing mucosa at the origin of the left mainstem bronchus.  Ultrasound revealed no definitive mass and blood vessels in close proximity to both areas.  CLINICAL NOTE:  Ms. Boss is a 80 year old woman, who had pneumonia in January.  She had a frequent cough with pleuritic chest pain.  A CT of the chest was done which showed a possible small nodule in the left mainstem bronchus.  A repeat CT 2 months later showed the area in question was unchanged.  She was advised to undergo bronchoscopy for biopsy and possible endobronchial mass removal.  The indications, risks, benefits, and alternatives were discussed in detail with the patient. She understood and accepted the risks and agreed to proceed.  OPERATIVE NOTE:  Ms. Moore was brought to the operating room on November 26, 2014.  She had induction of general anesthesia and was intubated.  After performing a time out, flexible fiberoptic bronchoscopy was performed via the endotracheal tube.  The trachea and right bronchial tree were normal to the level of the subsegmental bronchi.  On the left  side, the scope was advanced down the left mainstem.  1-2 cm above the bifurcation, there was a narrowing on the inferior/medial aspect. No mass was seen. This appeared to be an abnormality of the bronchial cartilage.  As the scope was advanced past this area, a ridge of tissue was seen on this posterior superior aspect of the left main stem at the level of the left upper lobe takeoff.  This corresponded to the finding on CT. The mucosa overlying this was normal.  There was no discrete mass.  Needle aspirations were taken from the rigid tissue at the takeoff of the left upper lobe bronchus.  Brushings were performed of this area as well. Finally multiple biopsies were taken.    The endobronchial ultrasound probe then was advanced prior to doing any deeper tissues sampling in the area of the cartilaginous abnormality in the left mainstem. A large vessel was seen, but there was no node or mass in the area.  The ultrasound probe then was advanced to the origin of the left upper lobe bronchus where the biopsies had been taken.  Multiple blood vessels were seen in close proximity.  There was no definite mass in the area. Due to blood vessels no additional sampling was performed.  The ultrasound probe was removed.  The bronchoscope was  reinserted.  A final inspection was made, and there was no ongoing bleeding.  The bronchoscope was removed.  The patient was extubated in the operating room and then taken to the postanesthetic care unit in good condition.     Salvatore Decent Dorris Fetch, M.D.     SCH/MEDQ  D:  11/26/2014  T:  11/27/2014  Job:  [id number]

## 2014-12-03 ENCOUNTER — Encounter: Payer: Self-pay | Admitting: Thoracic Surgery (Cardiothoracic Vascular Surgery)

## 2014-12-03 ENCOUNTER — Ambulatory Visit (INDEPENDENT_AMBULATORY_CARE_PROVIDER_SITE_OTHER): Payer: Commercial Managed Care - HMO | Admitting: Thoracic Surgery (Cardiothoracic Vascular Surgery)

## 2014-12-03 VITALS — BP 160/85 | HR 60 | Resp 20 | Ht 61.0 in | Wt 106.0 lb

## 2014-12-03 DIAGNOSIS — Z9889 Other specified postprocedural states: Secondary | ICD-10-CM

## 2014-12-03 DIAGNOSIS — R911 Solitary pulmonary nodule: Secondary | ICD-10-CM | POA: Diagnosis not present

## 2014-12-03 NOTE — Progress Notes (Signed)
      [address]       [place] [address]             [phone]      Patient ID: Maria Garrett, female   DOB: [birth year], 80 y.o.   MRN: [id number]  Mrs. [name] returns today to discuss the results of her bronchoscopy and EBUS  She is a 80 yo woman who has never smoked. She had pneumonia in the RLL in January. A CT in February showed a possible endobronchial mass in the left main stem at the takeoff of the left upper lobe bronchus. A repeat CT in April showed the abnormality was persistent.  I did a bronch and EBUS on her last week.  No tumor was seen. There was some deformity of the cartilage in the distal left main stem bronchus that was not readily apparent on CT. At the takeoff of the left upper lobe bronchus there was a ridge of tissue corresponding to the CT finding. Biopsies, brushings, needle aspirations and washings were done  Path and cytology showed a few atypical cells but no evidence of malignancy.  I think this is probably just an anatomical variant but can not totally rule out a small malignancy. I don't think repeating a bronchoscopy will be helpful in her case.  I recommended that we rescan her in 3 months to look at the area again.  She agrees  Will use IV contrast to get a better idea of the relationship to the blood vessels  Viviann Spare C. Dorris Fetch, MD [facility] [phone]

## 2014-12-08 DIAGNOSIS — Z79899 Other long term (current) drug therapy: Secondary | ICD-10-CM | POA: Diagnosis not present

## 2014-12-08 DIAGNOSIS — I10 Essential (primary) hypertension: Secondary | ICD-10-CM | POA: Diagnosis not present

## 2014-12-08 DIAGNOSIS — R938 Abnormal findings on diagnostic imaging of other specified body structures: Secondary | ICD-10-CM | POA: Diagnosis not present

## 2015-02-05 ENCOUNTER — Other Ambulatory Visit: Payer: Self-pay | Admitting: *Deleted

## 2015-02-05 DIAGNOSIS — R918 Other nonspecific abnormal finding of lung field: Secondary | ICD-10-CM

## 2015-03-10 ENCOUNTER — Ambulatory Visit
Admission: RE | Admit: 2015-03-10 | Discharge: 2015-03-10 | Disposition: A | Discharge status: Home / Self Care | Payer: Commercial Managed Care - HMO | Source: Ambulatory Visit | Attending: Thoracic Surgery (Cardiothoracic Vascular Surgery) | Admitting: Thoracic Surgery (Cardiothoracic Vascular Surgery)

## 2015-03-10 ENCOUNTER — Ambulatory Visit (INDEPENDENT_AMBULATORY_CARE_PROVIDER_SITE_OTHER): Payer: Commercial Managed Care - HMO | Admitting: Thoracic Surgery (Cardiothoracic Vascular Surgery)

## 2015-03-10 ENCOUNTER — Encounter: Payer: Self-pay | Admitting: Thoracic Surgery (Cardiothoracic Vascular Surgery)

## 2015-03-10 VITALS — BP 158/73 | HR 62 | Resp 16 | Ht 61.0 in | Wt 106.0 lb

## 2015-03-10 DIAGNOSIS — R918 Other nonspecific abnormal finding of lung field: Secondary | ICD-10-CM

## 2015-03-10 DIAGNOSIS — Z9889 Other specified postprocedural states: Secondary | ICD-10-CM | POA: Diagnosis not present

## 2015-03-10 DIAGNOSIS — R222 Localized swelling, mass and lump, trunk: Secondary | ICD-10-CM | POA: Diagnosis not present

## 2015-03-10 LAB — CREATININE, SERUM: Creat: 0.9 mg/dL (ref 0.60–0.93)

## 2015-03-10 MED ORDER — IOPAMIDOL (ISOVUE-300) INJECTION 61%
75.0000 mL | Freq: Once | INTRAVENOUS | Status: AC | PRN
  Administered 2015-03-10: 75 mL via INTRAVENOUS

## 2015-03-10 NOTE — Progress Notes (Signed)
[address]       [place] [address]             [phone]       HPI:  Mrs. Whisner returns for a scheduled follow-up visit.  She is a 80 yo lifelong non-smoker with a history of reflux, hypertension and vertigo. She had pneumonia in January 2016. Her symptoms initially improved with antibiotics, but by mid February she was having a frequent cough with pleuritic CP.   A CXR on 07/28/14 showed subsegmental atelectasis in the RLL. A chest CT was done on 08/05/14. It showed the area in question on the chest x-ray was epicardial fat. However, it did reveal a small polypoid nodule in the left main stem bronchus. There was a question as to whether this was just mucous. A repeat CT in April showed the same nodule.  I did bronchoscopy and endobronchial ultrasound on her on 11/26/2014. No discrete mass was found. There was a ridge of tissue at the takeoff of the left upper lobe bronchus. Biopsies needle aspirations showed a few atypical cells but no tumor was seen. I recommended that we do a repeat follow-up CT in 3 months and she now returns after having had that study done.  She says she feels tired. She attributes that to getting older. She has a history of reflux and has been having some nocturnal bloating and heartburn. She is not having any exertional symptoms. She denies any wheezing. She has a chronic cough which is unchanged.  Past Medical History  Diagnosis Date  . Esophageal reflux   . Hypertension   . Vertigo   . Osteoporosis     T score 3.7  . Fatty liver   . Dyslipidemia   . Chest pain 2015     neg stress myoview 2015  . PONV (postoperative nausea and vomiting)   . Pneumonia Jan 2016    Frequent  . Arthritis   . UTI (lower urinary tract infection)     Frequent  . C. difficile colitis     hx of      Current Outpatient Prescriptions  Medication Sig Dispense Refill  . amLODipine (NORVASC) 5 MG tablet Take 1 tablet by mouth daily.    Marland Kitchen BIOTIN PO  Take 2 capsules by mouth daily.    . calcium carbonate (TUMS - DOSED IN MG ELEMENTAL CALCIUM) 500 MG chewable tablet Chew 1 tablet by mouth daily as needed for indigestion or heartburn.     . losartan (COZAAR) 25 MG tablet Take 25 mg by mouth daily.    . meclizine (ANTIVERT) 25 MG tablet Take 25 mg by mouth 3 (three) times daily as needed for dizziness.    . Multiple Vitamins-Minerals (CENTRUM SILVER ADULT 50+ PO) Take 1 tablet by mouth daily.    . nitroGLYCERIN (NITROSTAT) 0.4 MG SL tablet Place 0.4 mg under the tongue every 5 (five) minutes as needed for chest pain.    . traZODone (DESYREL) 50 MG tablet Take 50 mg by mouth at bedtime as needed for sleep.    . vitamin B-12 (CYANOCOBALAMIN) 100 MCG tablet Take 100 mcg by mouth daily.     No current facility-administered medications for this visit.    Physical Exam BP 158/73 mmHg  Pulse 62  Resp 16  Ht  (1.549 m)  Wt 106 lb (48.081 kg)  BMI 20.04 kg/m2  SpO33 6% 80 year old woman in no acute distress Well-developed well-nourished No cervical or  supraclavicular adenopathy Cardiac regular rate and rhythm normal S1 and S2 Lungs clear with equal breath sound bilaterally, no stridor or wheezing  Diagnostic Tests: CT CHEST WITH CONTRAST  TECHNIQUE: Multidetector CT imaging of the chest was performed during intravenous contrast administration.  CONTRAST: 75mL ISOVUE-300 IOPAMIDOL (ISOVUE-300) INJECTION 61%  COMPARISON: 09/30/2014 and 08/05/2014 chest CT studies.  FINDINGS: Mediastinum/Nodes: Normal heart size. No pericardial fluid/thickening. There is atherosclerosis of the thoracic aorta, the great vessels of the mediastinum and the coronary arteries, including calcified atherosclerotic plaque in the left main, left anterior descending and right coronary arteries. Great vessels are normal in course and caliber. No central pulmonary emboli. Normal visualized thyroid. Normal esophagus. No axillary, mediastinal or hilar  lymphadenopathy.  Lungs/Pleura: No pneumothorax. No pleural effusion. Re- demonstrated is a hypodense 0.6 x 0.5 cm polypoid lesion protruding into the lumen of the anterior distal left mainstem bronchus (series 4/image 27), unchanged since 08/05/2014. No additional filling defects in the trachea or central airways. No acute consolidative airspace disease. No significant pulmonary nodules or lung masses. Minimal hypoventilatory changes in the dependent lower lobes.  Upper abdomen: Hypodense 0.6 cm lesion in the posterior interpolar left kidney, partially visualize, too small to characterize. Small hiatal hernia.  Musculoskeletal: No aggressive appearing focal osseous lesions.  IMPRESSION: 1. Persistent 0.6 cm hypodense polypoid lesion protruding into the lumen of the anterior distal left mainstem bronchus, stable since 08/05/2014, suggesting a benign etiology. A follow-up chest CT in 12 months is advised to document continued stability. 2. Atherosclerosis, including left main and two-vessel coronary artery disease. Please note that although the presence of coronary artery calcium documents the presence of coronary artery disease, the severity of this disease and any potential stenosis cannot be assessed on this non-gated CT examination.   Electronically Signed  By: Delbert Phenix M.D.  On: 03/10/2015 13:53  I personally reviewed her CT chest and compared it to her earlier films. It is unchanged.  Impression: 80 year old woman with an unusual anatomical variant in her distal left mainstem bronchus at the takeoff of the left upper lobe bronchus. Biopsies needle aspirations of this area showed no evidence of tumor. A follow-up CT scan now shows no change in the area. I will go ahead and order another CT in 12 months just to document continued stability.  She complains of being tired. She does have some evidence of atherosclerosis on her PET/CT. She does not give any history of  exertional pain or symptoms consistent with angina. I did advise her not to right off her feeling of tiredness just to her age. She will discuss whether any additional workup is necessary when she sees Dr. Sudie Bailey in October.  Plan:  Return in one year with CT of chest  I spent 10 minutes with Mrs. Staup face-to-face during this visit, greater than 50% was counseling  Loreli Slot, MD [facility] [phone]

## 2015-05-08 DIAGNOSIS — G4700 Insomnia, unspecified: Secondary | ICD-10-CM | POA: Diagnosis not present

## 2015-05-08 DIAGNOSIS — Z79899 Other long term (current) drug therapy: Secondary | ICD-10-CM | POA: Diagnosis not present

## 2015-05-08 DIAGNOSIS — E785 Hyperlipidemia, unspecified: Secondary | ICD-10-CM | POA: Diagnosis not present

## 2015-05-08 DIAGNOSIS — I10 Essential (primary) hypertension: Secondary | ICD-10-CM | POA: Diagnosis not present

## 2015-06-09 IMAGING — CR DG CHEST 2V
2 series · 2 of 2 positions shown · non-contrast
Comparison: None.

CLINICAL DATA: Lung nodule.  Preop respiratory exam

EXAM:
CHEST  2 VIEW

[w chest pa]
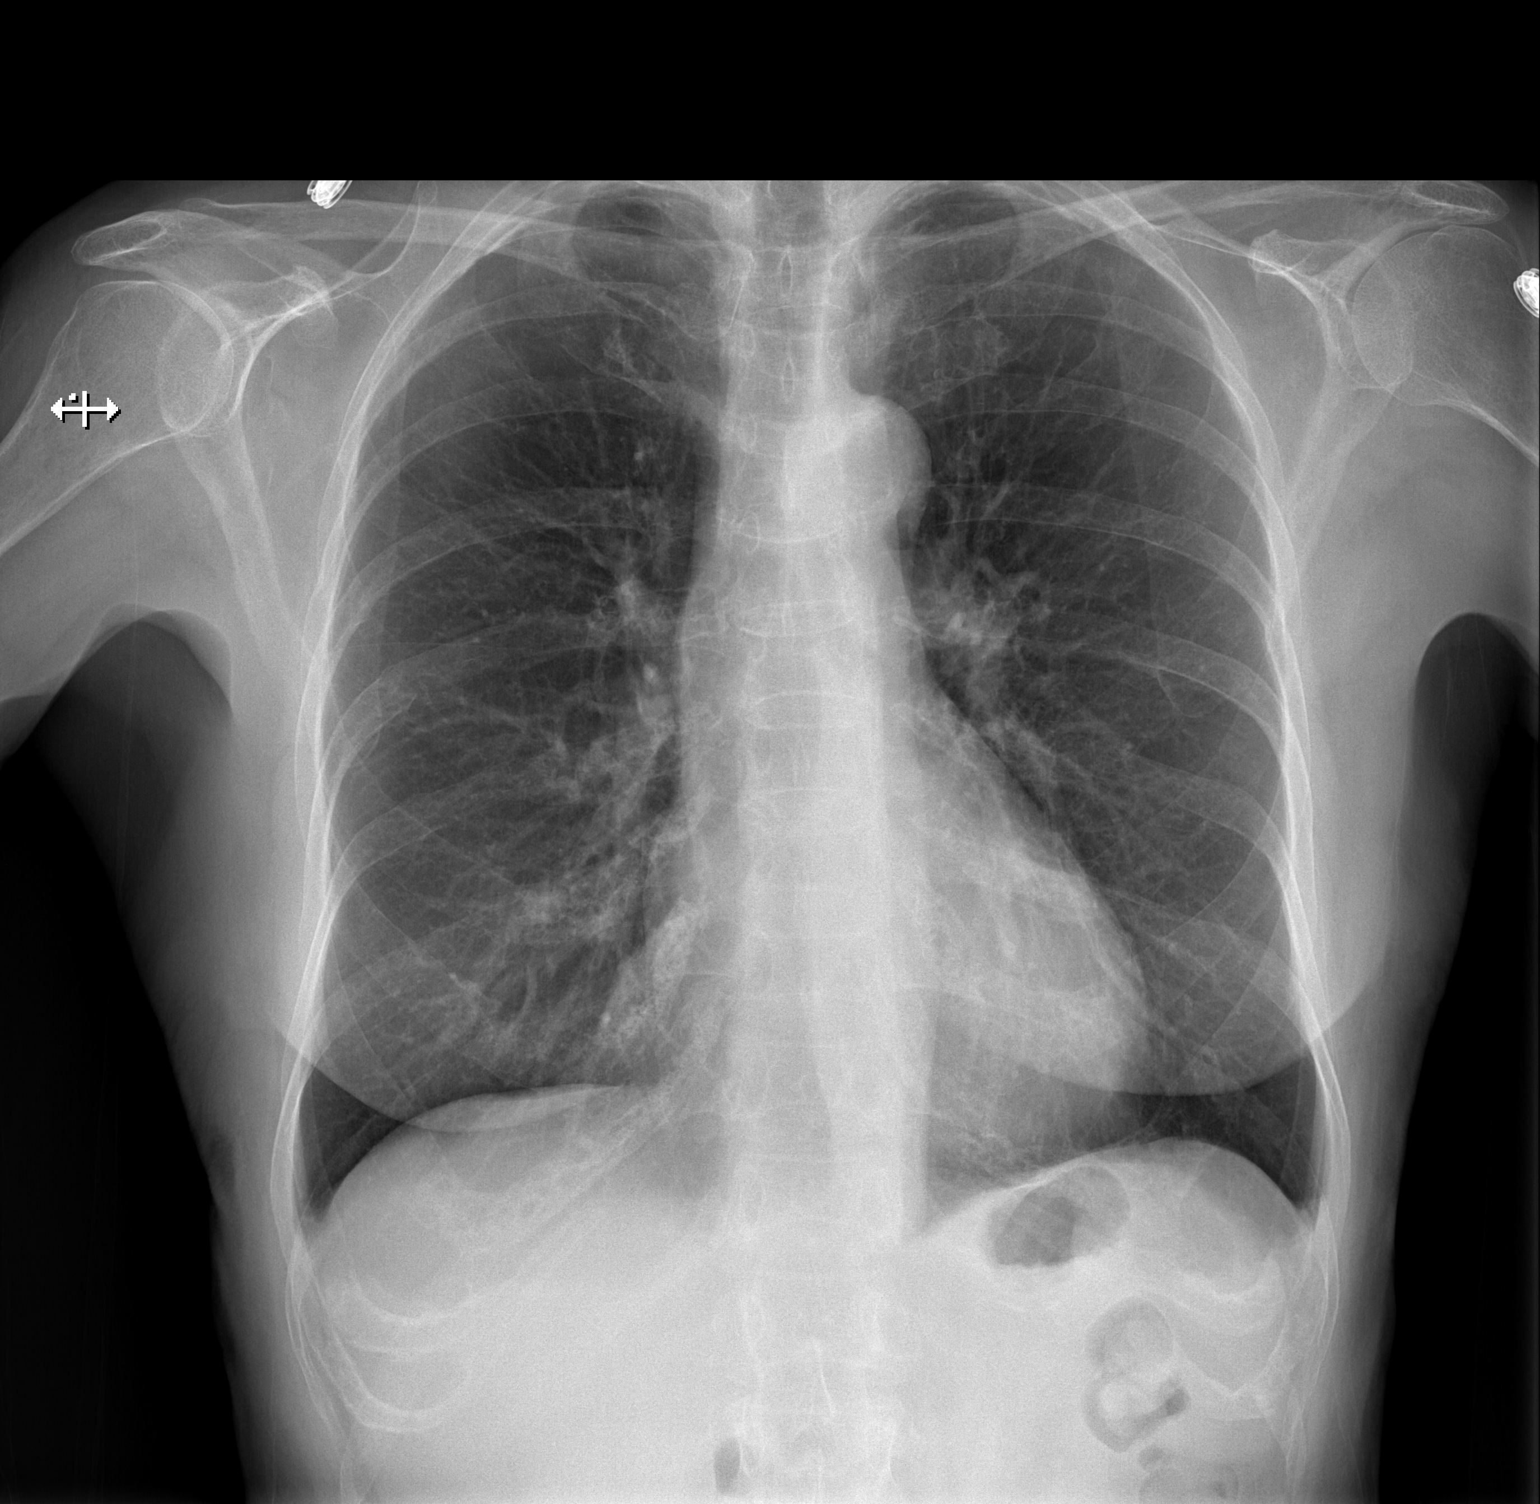

[w chest lat]
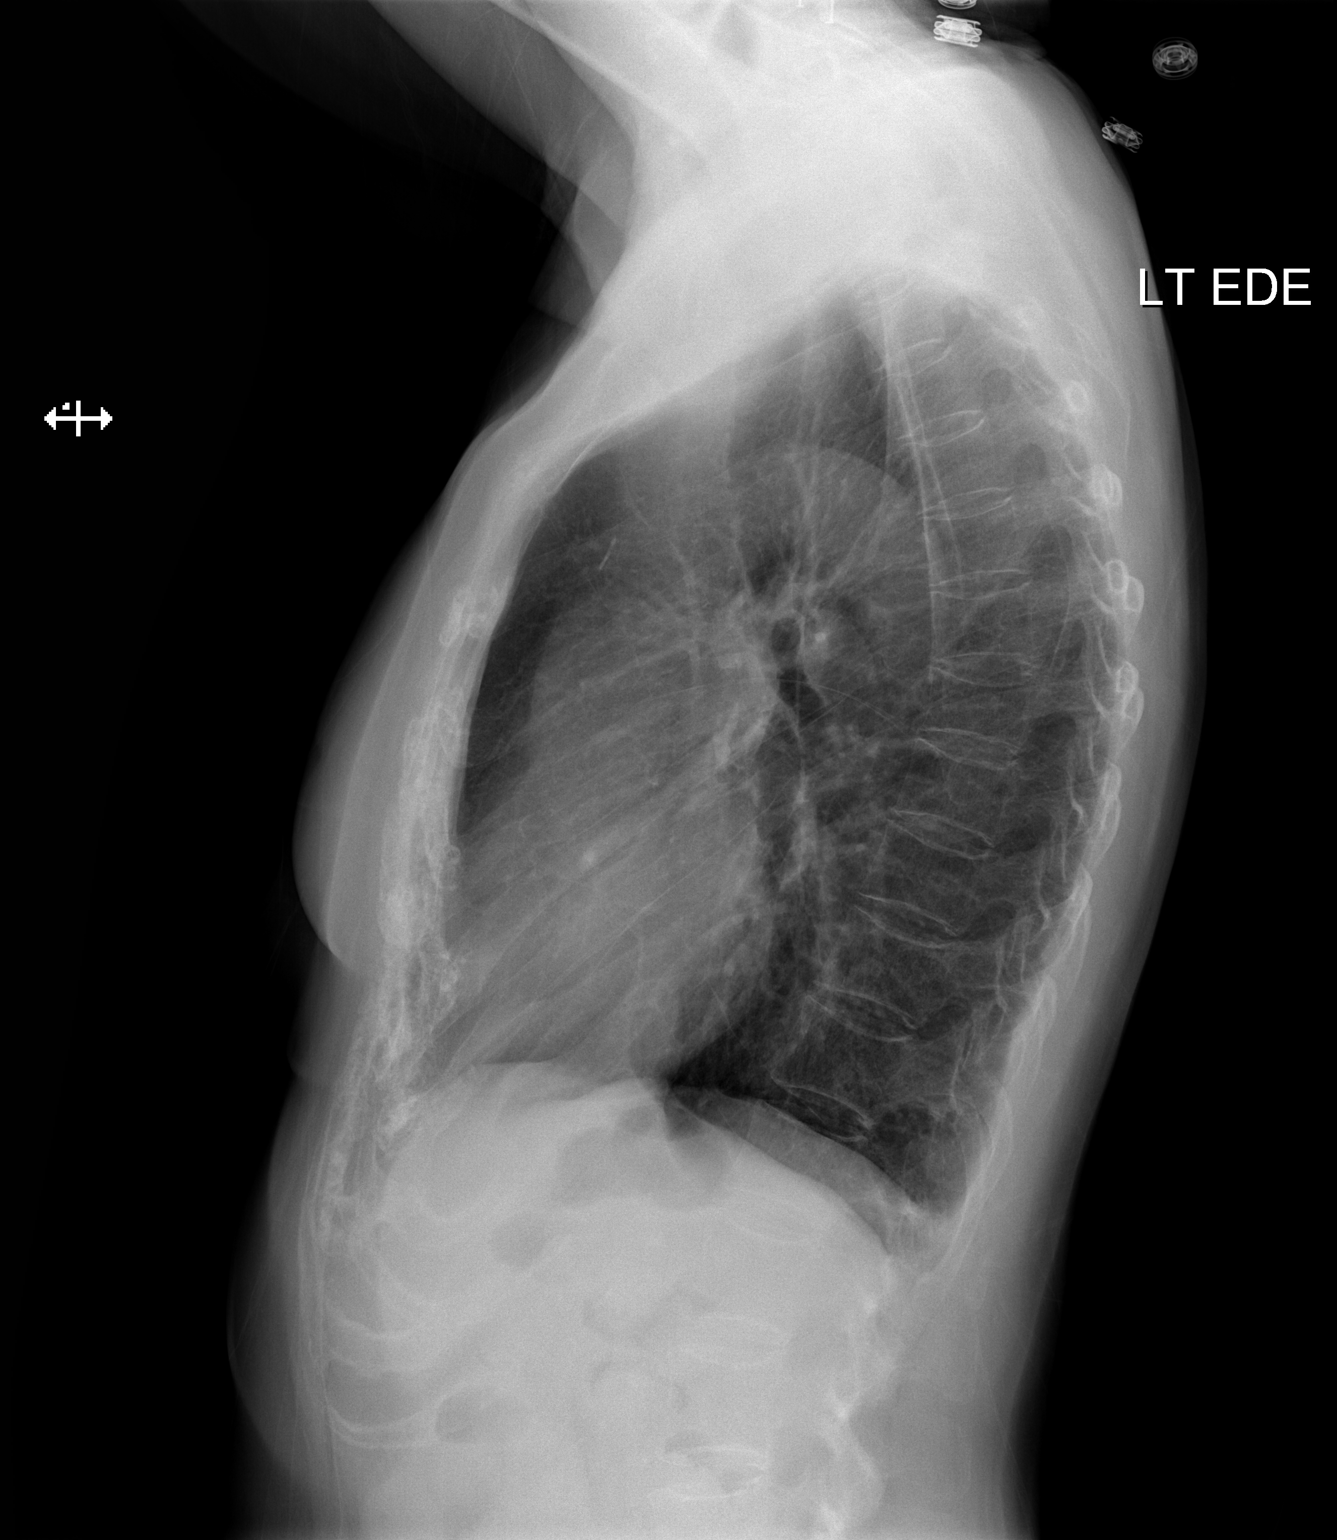

[2 of 2 positions shown; findings below may reference images not displayed]

FINDINGS: The heart size and mediastinal contours are within normal limits.
Mild pulmonary hyperinflation is seen, suspicious for COPD. No
evidence of pulmonary infiltrate or edema. No evidence pleural
effusion. Several old right rib fracture deformities incidentally
noted.
IMPRESSION: COPD.  No acute findings.

## 2015-08-13 DIAGNOSIS — H524 Presbyopia: Secondary | ICD-10-CM | POA: Diagnosis not present

## 2015-08-13 DIAGNOSIS — H353111 Nonexudative age-related macular degeneration, right eye, early dry stage: Secondary | ICD-10-CM | POA: Diagnosis not present

## 2015-08-13 DIAGNOSIS — I10 Essential (primary) hypertension: Secondary | ICD-10-CM | POA: Diagnosis not present

## 2015-08-13 DIAGNOSIS — Z961 Presence of intraocular lens: Secondary | ICD-10-CM | POA: Diagnosis not present

## 2015-08-13 DIAGNOSIS — H43813 Vitreous degeneration, bilateral: Secondary | ICD-10-CM | POA: Diagnosis not present

## 2015-08-13 DIAGNOSIS — H43393 Other vitreous opacities, bilateral: Secondary | ICD-10-CM | POA: Diagnosis not present

## 2015-08-13 DIAGNOSIS — Z9841 Cataract extraction status, right eye: Secondary | ICD-10-CM | POA: Diagnosis not present

## 2015-08-13 DIAGNOSIS — H5203 Hypermetropia, bilateral: Secondary | ICD-10-CM | POA: Diagnosis not present

## 2015-08-13 DIAGNOSIS — H52223 Regular astigmatism, bilateral: Secondary | ICD-10-CM | POA: Diagnosis not present

## 2015-09-11 DIAGNOSIS — R55 Syncope and collapse: Secondary | ICD-10-CM | POA: Diagnosis not present

## 2015-09-11 DIAGNOSIS — S82832A Other fracture of upper and lower end of left fibula, initial encounter for closed fracture: Secondary | ICD-10-CM | POA: Diagnosis not present

## 2015-09-11 DIAGNOSIS — S82202A Unspecified fracture of shaft of left tibia, initial encounter for closed fracture: Secondary | ICD-10-CM | POA: Diagnosis not present

## 2015-09-11 DIAGNOSIS — I10 Essential (primary) hypertension: Secondary | ICD-10-CM | POA: Diagnosis not present

## 2015-09-11 DIAGNOSIS — S82302A Unspecified fracture of lower end of left tibia, initial encounter for closed fracture: Secondary | ICD-10-CM | POA: Diagnosis not present

## 2015-09-11 DIAGNOSIS — S82831A Other fracture of upper and lower end of right fibula, initial encounter for closed fracture: Secondary | ICD-10-CM | POA: Diagnosis not present

## 2015-09-18 DIAGNOSIS — S82202A Unspecified fracture of shaft of left tibia, initial encounter for closed fracture: Secondary | ICD-10-CM | POA: Diagnosis not present

## 2015-09-21 IMAGING — CT CT CHEST W/ CM
2 of 3 series · 15 of 30 positions shown, 17 images · IV contrast (75CC ISOVUE 300)
Comparison: 09/30/2014 and 08/05/2014 chest CT studies.

CLINICAL DATA: 80-year-old female presents for follow-up of a
subcentimeter filling defect in the left mainstem bronchus. Interval
bronchoscopy performed on 11/26/2014 with brushings and biopsies of
a small ridge of tissue in the left mainstem bronchus demonstrated
atypical cells with no evidence of malignancy.

EXAM:
CT CHEST WITH CONTRAST
TECHNIQUE: Multidetector CT imaging of the chest was performed during
intravenous contrast administration.
CONTRAST:  75mL PTERRI-CHH IOPAMIDOL (PTERRI-CHH) INJECTION 61%

[Series 3: chest with · axial · 0.58mm/px · z∈[-242,-22]mm · 7 of 60 slices shown, 9 images]
[im 8/60  mediastinal]
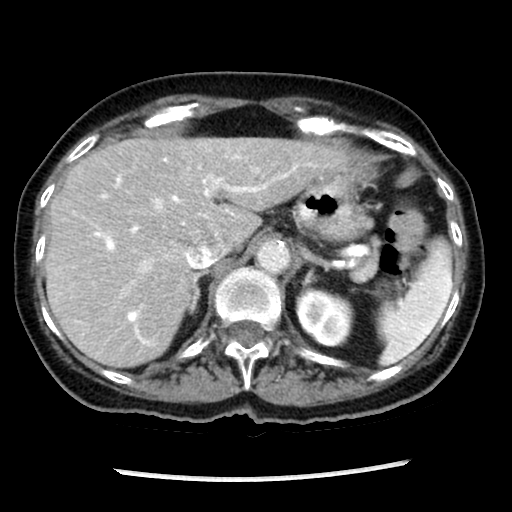
[im 8/60  lung]
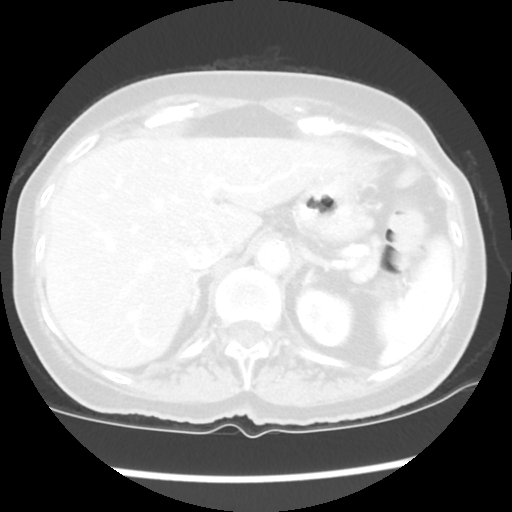
[im 15/60  lung]
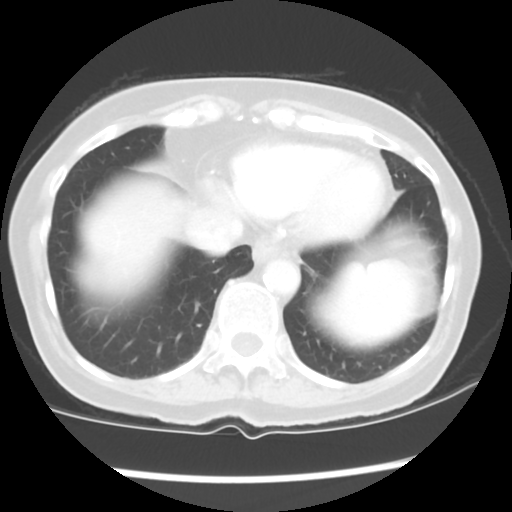
[im 23/60  lung]
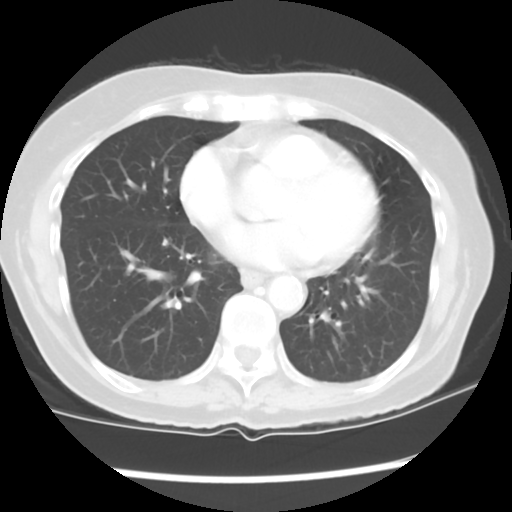
[im 30/60  lung]
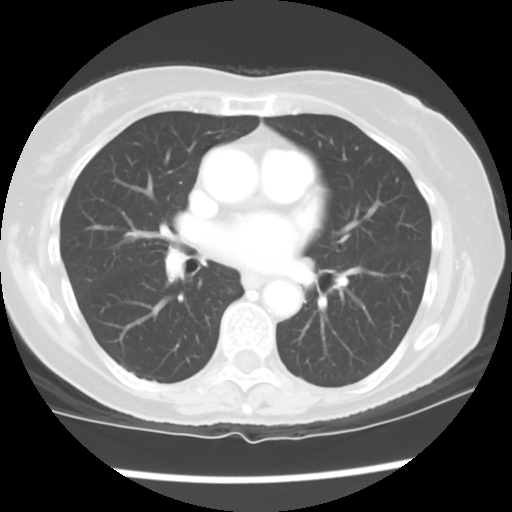
[im 37/60  mediastinal]
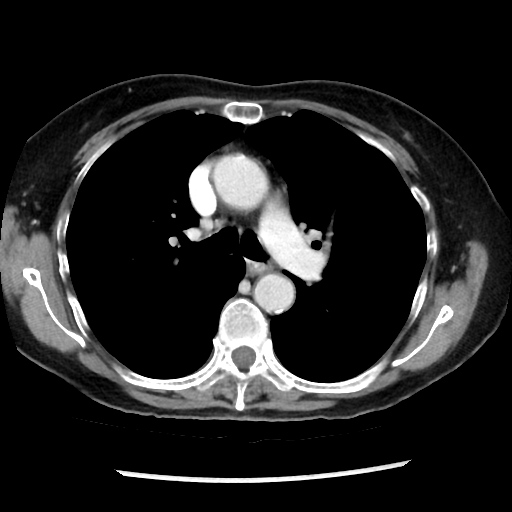
[im 37/60  lung]
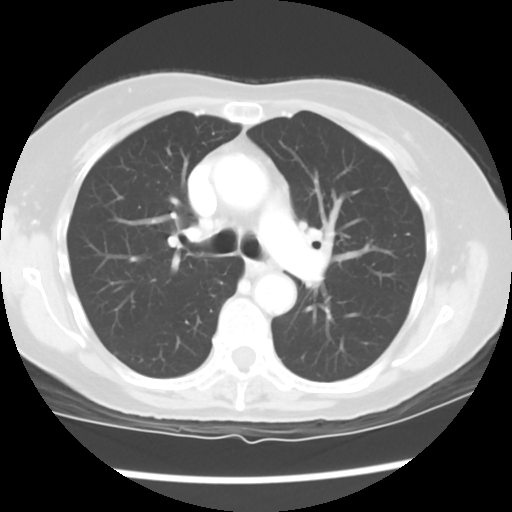
[im 45/60  lung]
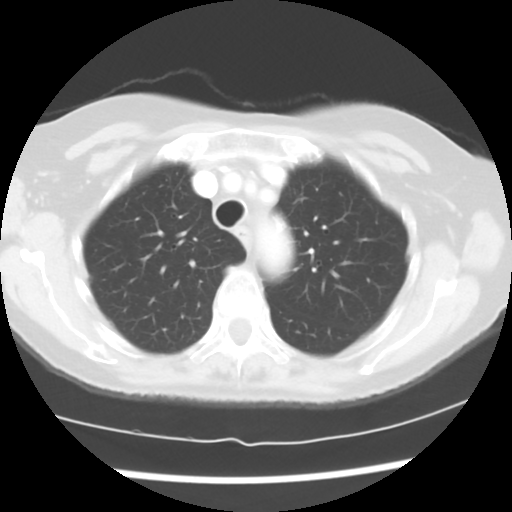
[im 52/60  lung]
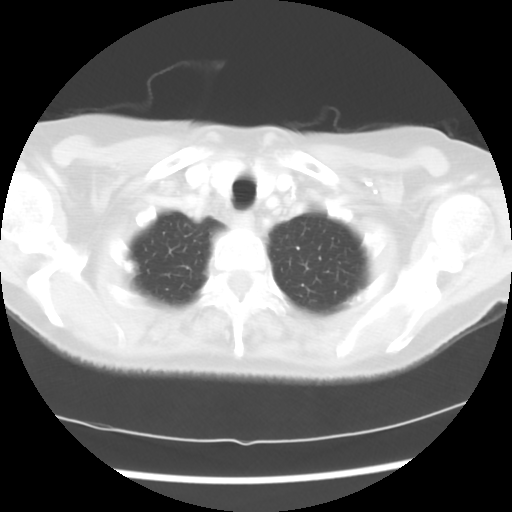

[Series 602: sagittal body · sagittal · 0.58mm/px · 8 of 120 slices shown]
[im 14/120  mediastinal]
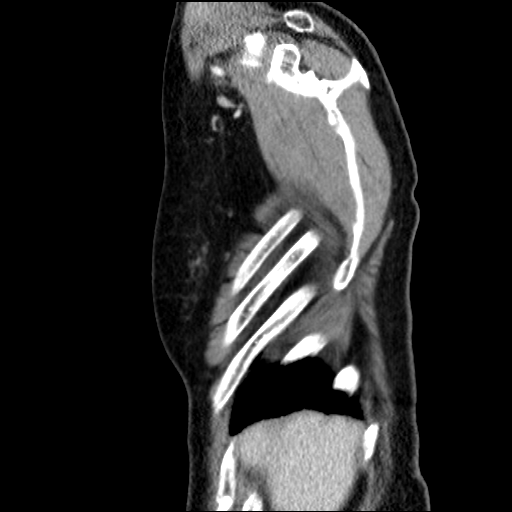
[im 27/120  mediastinal]
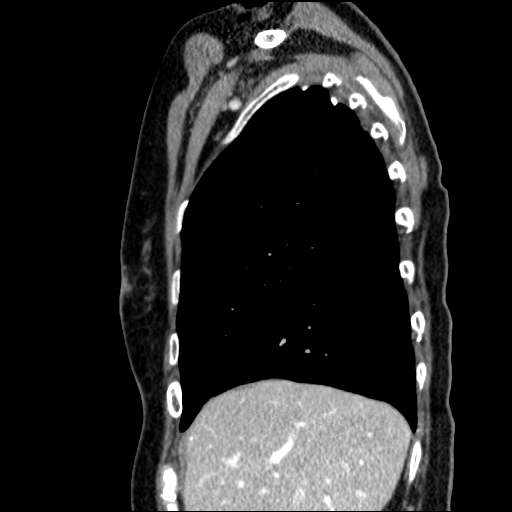
[im 40/120  mediastinal]
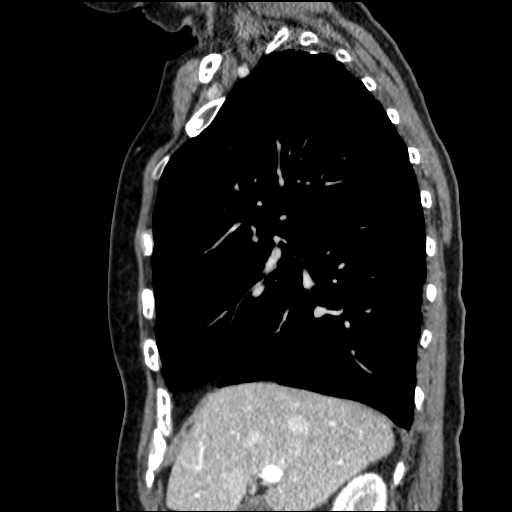
[im 53/120  mediastinal]
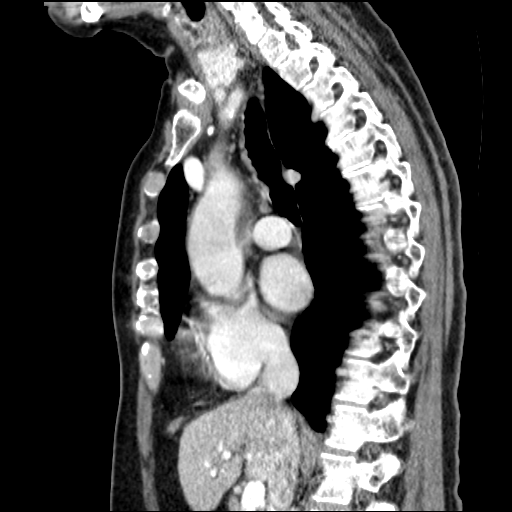
[im 67/120  mediastinal]
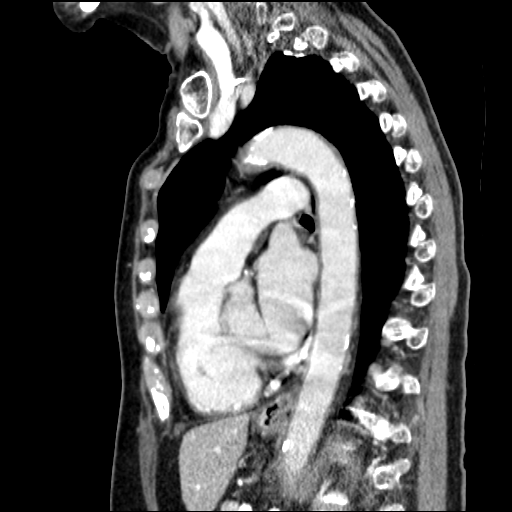
[im 80/120  mediastinal]
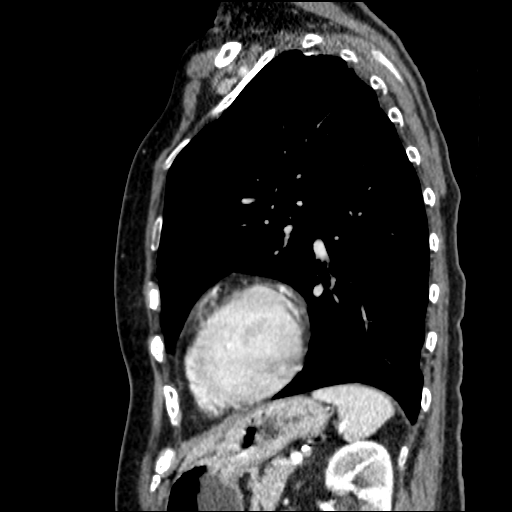
[im 93/120  mediastinal]
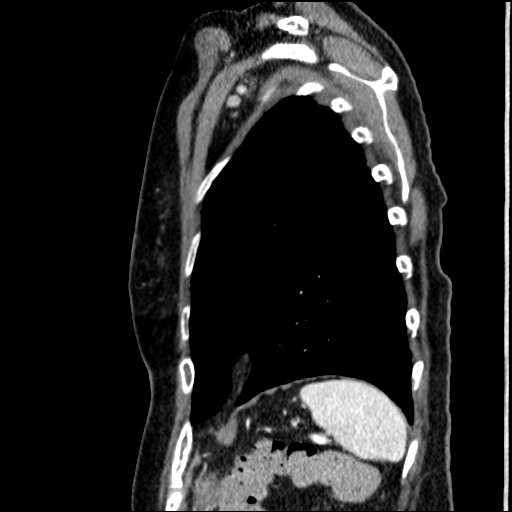
[im 106/120  mediastinal]
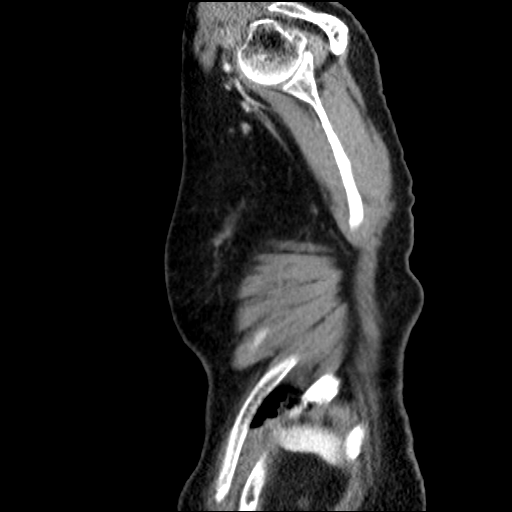

[15 of 30 positions shown; findings below may reference images not displayed]

FINDINGS: Mediastinum/Nodes: Normal heart size. No pericardial
fluid/thickening. There is atherosclerosis of the thoracic aorta,
the great vessels of the mediastinum and the coronary arteries,
including calcified atherosclerotic plaque in the left main, left
anterior descending and right coronary arteries. Great vessels are
normal in course and caliber. No central pulmonary emboli. Normal
visualized thyroid. Normal esophagus. No axillary, mediastinal or
hilar lymphadenopathy.

Lungs/Pleura: No pneumothorax. No pleural effusion. Re- demonstrated
is a hypodense 0.6 x 0.5 cm polypoid lesion protruding into the
lumen of the anterior distal left mainstem bronchus (series 4/image
27), unchanged since 08/05/2014. No additional filling defects in
the trachea or central airways. No acute consolidative airspace
disease. No significant pulmonary nodules or lung masses. Minimal
hypoventilatory changes in the dependent lower lobes.

Upper abdomen: Hypodense 0.6 cm lesion in the posterior interpolar
left kidney, partially visualize, too small to characterize. Small
hiatal hernia.

Musculoskeletal: No aggressive appearing focal osseous lesions.
IMPRESSION: 1. Persistent 0.6 cm hypodense polypoid lesion protruding into the
lumen of the anterior distal left mainstem bronchus, stable since
08/05/2014, suggesting a benign etiology. A follow-up chest CT in 12
months is advised to document continued stability.
2. Atherosclerosis, including left main and two-vessel coronary
artery disease. Please note that although the presence of coronary
artery calcium documents the presence of coronary artery disease,
the severity of this disease and any potential stenosis cannot be
assessed on this non-gated CT examination.

## 2015-09-25 DIAGNOSIS — S82202D Unspecified fracture of shaft of left tibia, subsequent encounter for closed fracture with routine healing: Secondary | ICD-10-CM | POA: Diagnosis not present

## 2015-09-29 DIAGNOSIS — Z9181 History of falling: Secondary | ICD-10-CM | POA: Diagnosis not present

## 2015-09-29 DIAGNOSIS — S82201D Unspecified fracture of shaft of right tibia, subsequent encounter for closed fracture with routine healing: Secondary | ICD-10-CM | POA: Diagnosis not present

## 2015-09-29 DIAGNOSIS — S82401D Unspecified fracture of shaft of right fibula, subsequent encounter for closed fracture with routine healing: Secondary | ICD-10-CM | POA: Diagnosis not present

## 2015-10-01 DIAGNOSIS — S82401D Unspecified fracture of shaft of right fibula, subsequent encounter for closed fracture with routine healing: Secondary | ICD-10-CM | POA: Diagnosis not present

## 2015-10-01 DIAGNOSIS — Z9181 History of falling: Secondary | ICD-10-CM | POA: Diagnosis not present

## 2015-10-01 DIAGNOSIS — S82201D Unspecified fracture of shaft of right tibia, subsequent encounter for closed fracture with routine healing: Secondary | ICD-10-CM | POA: Diagnosis not present

## 2015-10-05 DIAGNOSIS — S82202G Unspecified fracture of shaft of left tibia, subsequent encounter for closed fracture with delayed healing: Secondary | ICD-10-CM | POA: Diagnosis not present

## 2015-10-06 DIAGNOSIS — S82201D Unspecified fracture of shaft of right tibia, subsequent encounter for closed fracture with routine healing: Secondary | ICD-10-CM | POA: Diagnosis not present

## 2015-10-06 DIAGNOSIS — Z9181 History of falling: Secondary | ICD-10-CM | POA: Diagnosis not present

## 2015-10-06 DIAGNOSIS — S82401D Unspecified fracture of shaft of right fibula, subsequent encounter for closed fracture with routine healing: Secondary | ICD-10-CM | POA: Diagnosis not present

## 2015-10-07 DIAGNOSIS — Z9181 History of falling: Secondary | ICD-10-CM | POA: Diagnosis not present

## 2015-10-07 DIAGNOSIS — S82201D Unspecified fracture of shaft of right tibia, subsequent encounter for closed fracture with routine healing: Secondary | ICD-10-CM | POA: Diagnosis not present

## 2015-10-07 DIAGNOSIS — Z01810 Encounter for preprocedural cardiovascular examination: Secondary | ICD-10-CM | POA: Diagnosis not present

## 2015-10-07 DIAGNOSIS — Z01812 Encounter for preprocedural laboratory examination: Secondary | ICD-10-CM | POA: Diagnosis not present

## 2015-10-07 DIAGNOSIS — S82401D Unspecified fracture of shaft of right fibula, subsequent encounter for closed fracture with routine healing: Secondary | ICD-10-CM | POA: Diagnosis not present

## 2015-10-07 DIAGNOSIS — S82202G Unspecified fracture of shaft of left tibia, subsequent encounter for closed fracture with delayed healing: Secondary | ICD-10-CM | POA: Diagnosis not present

## 2015-10-07 DIAGNOSIS — Z01818 Encounter for other preprocedural examination: Secondary | ICD-10-CM | POA: Diagnosis not present

## 2015-10-08 DIAGNOSIS — K219 Gastro-esophageal reflux disease without esophagitis: Secondary | ICD-10-CM | POA: Diagnosis not present

## 2015-10-08 DIAGNOSIS — S82832A Other fracture of upper and lower end of left fibula, initial encounter for closed fracture: Secondary | ICD-10-CM | POA: Diagnosis not present

## 2015-10-08 DIAGNOSIS — S82202A Unspecified fracture of shaft of left tibia, initial encounter for closed fracture: Secondary | ICD-10-CM | POA: Diagnosis not present

## 2015-10-08 DIAGNOSIS — S82302A Unspecified fracture of lower end of left tibia, initial encounter for closed fracture: Secondary | ICD-10-CM | POA: Diagnosis not present

## 2015-10-08 DIAGNOSIS — Z79899 Other long term (current) drug therapy: Secondary | ICD-10-CM | POA: Diagnosis not present

## 2015-10-08 DIAGNOSIS — N189 Chronic kidney disease, unspecified: Secondary | ICD-10-CM | POA: Diagnosis not present

## 2015-10-08 DIAGNOSIS — S82202D Unspecified fracture of shaft of left tibia, subsequent encounter for closed fracture with routine healing: Secondary | ICD-10-CM | POA: Diagnosis not present

## 2015-10-08 DIAGNOSIS — Z9181 History of falling: Secondary | ICD-10-CM | POA: Diagnosis not present

## 2015-10-08 DIAGNOSIS — D631 Anemia in chronic kidney disease: Secondary | ICD-10-CM | POA: Diagnosis not present

## 2015-10-08 DIAGNOSIS — M80062A Age-related osteoporosis with current pathological fracture, left lower leg, initial encounter for fracture: Secondary | ICD-10-CM | POA: Diagnosis not present

## 2015-10-08 DIAGNOSIS — S82402D Unspecified fracture of shaft of left fibula, subsequent encounter for closed fracture with routine healing: Secondary | ICD-10-CM | POA: Diagnosis not present

## 2015-10-08 DIAGNOSIS — I129 Hypertensive chronic kidney disease with stage 1 through stage 4 chronic kidney disease, or unspecified chronic kidney disease: Secondary | ICD-10-CM | POA: Diagnosis not present

## 2015-10-08 DIAGNOSIS — M810 Age-related osteoporosis without current pathological fracture: Secondary | ICD-10-CM | POA: Diagnosis not present

## 2015-10-09 DIAGNOSIS — K219 Gastro-esophageal reflux disease without esophagitis: Secondary | ICD-10-CM | POA: Diagnosis not present

## 2015-10-09 DIAGNOSIS — S82202D Unspecified fracture of shaft of left tibia, subsequent encounter for closed fracture with routine healing: Secondary | ICD-10-CM | POA: Diagnosis not present

## 2015-10-09 DIAGNOSIS — N189 Chronic kidney disease, unspecified: Secondary | ICD-10-CM | POA: Diagnosis not present

## 2015-10-09 DIAGNOSIS — Z79899 Other long term (current) drug therapy: Secondary | ICD-10-CM | POA: Diagnosis not present

## 2015-10-09 DIAGNOSIS — I129 Hypertensive chronic kidney disease with stage 1 through stage 4 chronic kidney disease, or unspecified chronic kidney disease: Secondary | ICD-10-CM | POA: Diagnosis not present

## 2015-10-09 DIAGNOSIS — S82402D Unspecified fracture of shaft of left fibula, subsequent encounter for closed fracture with routine healing: Secondary | ICD-10-CM | POA: Diagnosis not present

## 2015-10-09 DIAGNOSIS — Z9181 History of falling: Secondary | ICD-10-CM | POA: Diagnosis not present

## 2015-10-09 DIAGNOSIS — D631 Anemia in chronic kidney disease: Secondary | ICD-10-CM | POA: Diagnosis not present

## 2015-10-09 DIAGNOSIS — M810 Age-related osteoporosis without current pathological fracture: Secondary | ICD-10-CM | POA: Diagnosis not present

## 2015-10-10 DIAGNOSIS — I119 Hypertensive heart disease without heart failure: Secondary | ICD-10-CM | POA: Diagnosis not present

## 2015-10-10 DIAGNOSIS — N189 Chronic kidney disease, unspecified: Secondary | ICD-10-CM | POA: Diagnosis not present

## 2015-10-10 DIAGNOSIS — I10 Essential (primary) hypertension: Secondary | ICD-10-CM | POA: Diagnosis not present

## 2015-10-10 DIAGNOSIS — Z7401 Bed confinement status: Secondary | ICD-10-CM | POA: Diagnosis not present

## 2015-10-10 DIAGNOSIS — M439 Deforming dorsopathy, unspecified: Secondary | ICD-10-CM | POA: Diagnosis not present

## 2015-10-10 DIAGNOSIS — D649 Anemia, unspecified: Secondary | ICD-10-CM | POA: Diagnosis not present

## 2015-10-10 DIAGNOSIS — S82402D Unspecified fracture of shaft of left fibula, subsequent encounter for closed fracture with routine healing: Secondary | ICD-10-CM | POA: Diagnosis not present

## 2015-10-10 DIAGNOSIS — R279 Unspecified lack of coordination: Secondary | ICD-10-CM | POA: Diagnosis not present

## 2015-10-10 DIAGNOSIS — D631 Anemia in chronic kidney disease: Secondary | ICD-10-CM | POA: Diagnosis not present

## 2015-10-10 DIAGNOSIS — K219 Gastro-esophageal reflux disease without esophagitis: Secondary | ICD-10-CM | POA: Diagnosis not present

## 2015-10-10 DIAGNOSIS — M810 Age-related osteoporosis without current pathological fracture: Secondary | ICD-10-CM | POA: Diagnosis not present

## 2015-10-10 DIAGNOSIS — S82202D Unspecified fracture of shaft of left tibia, subsequent encounter for closed fracture with routine healing: Secondary | ICD-10-CM | POA: Diagnosis not present

## 2015-10-10 DIAGNOSIS — Z79899 Other long term (current) drug therapy: Secondary | ICD-10-CM | POA: Diagnosis not present

## 2015-10-10 DIAGNOSIS — S82235D Nondisplaced oblique fracture of shaft of left tibia, subsequent encounter for closed fracture with routine healing: Secondary | ICD-10-CM | POA: Diagnosis not present

## 2015-10-10 DIAGNOSIS — G8918 Other acute postprocedural pain: Secondary | ICD-10-CM | POA: Diagnosis not present

## 2015-10-10 DIAGNOSIS — Z9181 History of falling: Secondary | ICD-10-CM | POA: Diagnosis not present

## 2015-10-10 DIAGNOSIS — I129 Hypertensive chronic kidney disease with stage 1 through stage 4 chronic kidney disease, or unspecified chronic kidney disease: Secondary | ICD-10-CM | POA: Diagnosis not present

## 2015-10-13 DIAGNOSIS — G8918 Other acute postprocedural pain: Secondary | ICD-10-CM | POA: Diagnosis not present

## 2015-10-13 DIAGNOSIS — I119 Hypertensive heart disease without heart failure: Secondary | ICD-10-CM | POA: Diagnosis not present

## 2015-10-13 DIAGNOSIS — D649 Anemia, unspecified: Secondary | ICD-10-CM | POA: Diagnosis not present

## 2015-10-13 DIAGNOSIS — M439 Deforming dorsopathy, unspecified: Secondary | ICD-10-CM | POA: Diagnosis not present

## 2015-10-23 DIAGNOSIS — S82202D Unspecified fracture of shaft of left tibia, subsequent encounter for closed fracture with routine healing: Secondary | ICD-10-CM | POA: Diagnosis not present

## 2015-11-05 DIAGNOSIS — S82401D Unspecified fracture of shaft of right fibula, subsequent encounter for closed fracture with routine healing: Secondary | ICD-10-CM | POA: Diagnosis not present

## 2015-11-05 DIAGNOSIS — Z9181 History of falling: Secondary | ICD-10-CM | POA: Diagnosis not present

## 2015-11-05 DIAGNOSIS — S82201D Unspecified fracture of shaft of right tibia, subsequent encounter for closed fracture with routine healing: Secondary | ICD-10-CM | POA: Diagnosis not present

## 2015-11-08 DIAGNOSIS — M25559 Pain in unspecified hip: Secondary | ICD-10-CM | POA: Diagnosis not present

## 2015-11-08 DIAGNOSIS — M1990 Unspecified osteoarthritis, unspecified site: Secondary | ICD-10-CM | POA: Diagnosis not present

## 2015-11-08 DIAGNOSIS — D649 Anemia, unspecified: Secondary | ICD-10-CM | POA: Diagnosis not present

## 2015-11-08 DIAGNOSIS — D62 Acute posthemorrhagic anemia: Secondary | ICD-10-CM | POA: Diagnosis not present

## 2015-11-08 DIAGNOSIS — I2510 Atherosclerotic heart disease of native coronary artery without angina pectoris: Secondary | ICD-10-CM | POA: Diagnosis not present

## 2015-11-08 DIAGNOSIS — J449 Chronic obstructive pulmonary disease, unspecified: Secondary | ICD-10-CM | POA: Diagnosis not present

## 2015-11-08 DIAGNOSIS — N3001 Acute cystitis with hematuria: Secondary | ICD-10-CM | POA: Diagnosis not present

## 2015-11-08 DIAGNOSIS — S8992XA Unspecified injury of left lower leg, initial encounter: Secondary | ICD-10-CM | POA: Diagnosis not present

## 2015-11-08 DIAGNOSIS — R259 Unspecified abnormal involuntary movements: Secondary | ICD-10-CM | POA: Diagnosis not present

## 2015-11-08 DIAGNOSIS — S72002D Fracture of unspecified part of neck of left femur, subsequent encounter for closed fracture with routine healing: Secondary | ICD-10-CM | POA: Diagnosis not present

## 2015-11-08 DIAGNOSIS — Z9181 History of falling: Secondary | ICD-10-CM | POA: Diagnosis not present

## 2015-11-08 DIAGNOSIS — N390 Urinary tract infection, site not specified: Secondary | ICD-10-CM | POA: Diagnosis not present

## 2015-11-08 DIAGNOSIS — S299XXA Unspecified injury of thorax, initial encounter: Secondary | ICD-10-CM | POA: Diagnosis not present

## 2015-11-08 DIAGNOSIS — Z96642 Presence of left artificial hip joint: Secondary | ICD-10-CM | POA: Diagnosis not present

## 2015-11-08 DIAGNOSIS — I10 Essential (primary) hypertension: Secondary | ICD-10-CM | POA: Diagnosis not present

## 2015-11-08 DIAGNOSIS — S72012A Unspecified intracapsular fracture of left femur, initial encounter for closed fracture: Secondary | ICD-10-CM | POA: Diagnosis not present

## 2015-11-08 DIAGNOSIS — R42 Dizziness and giddiness: Secondary | ICD-10-CM | POA: Diagnosis not present

## 2015-11-08 DIAGNOSIS — R339 Retention of urine, unspecified: Secondary | ICD-10-CM | POA: Diagnosis not present

## 2015-11-08 DIAGNOSIS — S72002A Fracture of unspecified part of neck of left femur, initial encounter for closed fracture: Secondary | ICD-10-CM | POA: Diagnosis not present

## 2015-11-08 DIAGNOSIS — S72009A Fracture of unspecified part of neck of unspecified femur, initial encounter for closed fracture: Secondary | ICD-10-CM | POA: Diagnosis not present

## 2015-11-08 DIAGNOSIS — Z471 Aftercare following joint replacement surgery: Secondary | ICD-10-CM | POA: Diagnosis not present

## 2015-11-08 DIAGNOSIS — R509 Fever, unspecified: Secondary | ICD-10-CM | POA: Diagnosis not present

## 2015-11-13 DIAGNOSIS — G8918 Other acute postprocedural pain: Secondary | ICD-10-CM | POA: Diagnosis not present

## 2015-11-13 DIAGNOSIS — R339 Retention of urine, unspecified: Secondary | ICD-10-CM | POA: Diagnosis not present

## 2015-11-13 DIAGNOSIS — S72009A Fracture of unspecified part of neck of unspecified femur, initial encounter for closed fracture: Secondary | ICD-10-CM | POA: Diagnosis not present

## 2015-11-13 DIAGNOSIS — S82202D Unspecified fracture of shaft of left tibia, subsequent encounter for closed fracture with routine healing: Secondary | ICD-10-CM | POA: Diagnosis not present

## 2015-11-13 DIAGNOSIS — N3001 Acute cystitis with hematuria: Secondary | ICD-10-CM | POA: Diagnosis not present

## 2015-11-13 DIAGNOSIS — D649 Anemia, unspecified: Secondary | ICD-10-CM | POA: Diagnosis not present

## 2015-11-13 DIAGNOSIS — R262 Difficulty in walking, not elsewhere classified: Secondary | ICD-10-CM | POA: Diagnosis not present

## 2015-11-13 DIAGNOSIS — M439 Deforming dorsopathy, unspecified: Secondary | ICD-10-CM | POA: Diagnosis not present

## 2015-11-13 DIAGNOSIS — S72002D Fracture of unspecified part of neck of left femur, subsequent encounter for closed fracture with routine healing: Secondary | ICD-10-CM | POA: Diagnosis not present

## 2015-11-13 DIAGNOSIS — N390 Urinary tract infection, site not specified: Secondary | ICD-10-CM | POA: Diagnosis not present

## 2015-11-13 DIAGNOSIS — R259 Unspecified abnormal involuntary movements: Secondary | ICD-10-CM | POA: Diagnosis not present

## 2015-11-19 DIAGNOSIS — M439 Deforming dorsopathy, unspecified: Secondary | ICD-10-CM | POA: Diagnosis not present

## 2015-11-19 DIAGNOSIS — D649 Anemia, unspecified: Secondary | ICD-10-CM | POA: Diagnosis not present

## 2015-11-19 DIAGNOSIS — G8918 Other acute postprocedural pain: Secondary | ICD-10-CM | POA: Diagnosis not present

## 2015-11-19 DIAGNOSIS — R262 Difficulty in walking, not elsewhere classified: Secondary | ICD-10-CM | POA: Diagnosis not present

## 2015-11-22 DIAGNOSIS — N390 Urinary tract infection, site not specified: Secondary | ICD-10-CM | POA: Diagnosis not present

## 2015-11-26 DIAGNOSIS — S82202D Unspecified fracture of shaft of left tibia, subsequent encounter for closed fracture with routine healing: Secondary | ICD-10-CM | POA: Diagnosis not present

## 2015-12-07 DIAGNOSIS — Z79891 Long term (current) use of opiate analgesic: Secondary | ICD-10-CM | POA: Diagnosis not present

## 2015-12-07 DIAGNOSIS — Z8781 Personal history of (healed) traumatic fracture: Secondary | ICD-10-CM | POA: Diagnosis not present

## 2015-12-07 DIAGNOSIS — J449 Chronic obstructive pulmonary disease, unspecified: Secondary | ICD-10-CM | POA: Diagnosis not present

## 2015-12-07 DIAGNOSIS — I2510 Atherosclerotic heart disease of native coronary artery without angina pectoris: Secondary | ICD-10-CM | POA: Diagnosis not present

## 2015-12-07 DIAGNOSIS — I10 Essential (primary) hypertension: Secondary | ICD-10-CM | POA: Diagnosis not present

## 2015-12-07 DIAGNOSIS — M80052D Age-related osteoporosis with current pathological fracture, left femur, subsequent encounter for fracture with routine healing: Secondary | ICD-10-CM | POA: Diagnosis not present

## 2015-12-07 DIAGNOSIS — Z9181 History of falling: Secondary | ICD-10-CM | POA: Diagnosis not present

## 2015-12-07 DIAGNOSIS — R42 Dizziness and giddiness: Secondary | ICD-10-CM | POA: Diagnosis not present

## 2015-12-07 DIAGNOSIS — M1991 Primary osteoarthritis, unspecified site: Secondary | ICD-10-CM | POA: Diagnosis not present

## 2015-12-08 DIAGNOSIS — I10 Essential (primary) hypertension: Secondary | ICD-10-CM | POA: Diagnosis not present

## 2015-12-08 DIAGNOSIS — Z79899 Other long term (current) drug therapy: Secondary | ICD-10-CM | POA: Diagnosis not present

## 2015-12-08 DIAGNOSIS — R42 Dizziness and giddiness: Secondary | ICD-10-CM | POA: Diagnosis not present

## 2015-12-08 DIAGNOSIS — Z9181 History of falling: Secondary | ICD-10-CM | POA: Diagnosis not present

## 2015-12-08 DIAGNOSIS — J449 Chronic obstructive pulmonary disease, unspecified: Secondary | ICD-10-CM | POA: Diagnosis not present

## 2015-12-08 DIAGNOSIS — M1991 Primary osteoarthritis, unspecified site: Secondary | ICD-10-CM | POA: Diagnosis not present

## 2015-12-08 DIAGNOSIS — I2510 Atherosclerotic heart disease of native coronary artery without angina pectoris: Secondary | ICD-10-CM | POA: Diagnosis not present

## 2015-12-08 DIAGNOSIS — M80052D Age-related osteoporosis with current pathological fracture, left femur, subsequent encounter for fracture with routine healing: Secondary | ICD-10-CM | POA: Diagnosis not present

## 2015-12-08 DIAGNOSIS — Z8781 Personal history of (healed) traumatic fracture: Secondary | ICD-10-CM | POA: Diagnosis not present

## 2015-12-08 DIAGNOSIS — R634 Abnormal weight loss: Secondary | ICD-10-CM | POA: Diagnosis not present

## 2015-12-08 DIAGNOSIS — Z79891 Long term (current) use of opiate analgesic: Secondary | ICD-10-CM | POA: Diagnosis not present

## 2015-12-09 DIAGNOSIS — M1991 Primary osteoarthritis, unspecified site: Secondary | ICD-10-CM | POA: Diagnosis not present

## 2015-12-09 DIAGNOSIS — I10 Essential (primary) hypertension: Secondary | ICD-10-CM | POA: Diagnosis not present

## 2015-12-09 DIAGNOSIS — Z9181 History of falling: Secondary | ICD-10-CM | POA: Diagnosis not present

## 2015-12-09 DIAGNOSIS — Z8781 Personal history of (healed) traumatic fracture: Secondary | ICD-10-CM | POA: Diagnosis not present

## 2015-12-09 DIAGNOSIS — R42 Dizziness and giddiness: Secondary | ICD-10-CM | POA: Diagnosis not present

## 2015-12-09 DIAGNOSIS — M80052D Age-related osteoporosis with current pathological fracture, left femur, subsequent encounter for fracture with routine healing: Secondary | ICD-10-CM | POA: Diagnosis not present

## 2015-12-09 DIAGNOSIS — J449 Chronic obstructive pulmonary disease, unspecified: Secondary | ICD-10-CM | POA: Diagnosis not present

## 2015-12-09 DIAGNOSIS — Z79891 Long term (current) use of opiate analgesic: Secondary | ICD-10-CM | POA: Diagnosis not present

## 2015-12-09 DIAGNOSIS — I2510 Atherosclerotic heart disease of native coronary artery without angina pectoris: Secondary | ICD-10-CM | POA: Diagnosis not present

## 2015-12-10 DIAGNOSIS — M80052D Age-related osteoporosis with current pathological fracture, left femur, subsequent encounter for fracture with routine healing: Secondary | ICD-10-CM | POA: Diagnosis not present

## 2015-12-10 DIAGNOSIS — I2510 Atherosclerotic heart disease of native coronary artery without angina pectoris: Secondary | ICD-10-CM | POA: Diagnosis not present

## 2015-12-10 DIAGNOSIS — R42 Dizziness and giddiness: Secondary | ICD-10-CM | POA: Diagnosis not present

## 2015-12-10 DIAGNOSIS — Z9181 History of falling: Secondary | ICD-10-CM | POA: Diagnosis not present

## 2015-12-10 DIAGNOSIS — Z79891 Long term (current) use of opiate analgesic: Secondary | ICD-10-CM | POA: Diagnosis not present

## 2015-12-10 DIAGNOSIS — M1991 Primary osteoarthritis, unspecified site: Secondary | ICD-10-CM | POA: Diagnosis not present

## 2015-12-10 DIAGNOSIS — I10 Essential (primary) hypertension: Secondary | ICD-10-CM | POA: Diagnosis not present

## 2015-12-10 DIAGNOSIS — J449 Chronic obstructive pulmonary disease, unspecified: Secondary | ICD-10-CM | POA: Diagnosis not present

## 2015-12-10 DIAGNOSIS — Z8781 Personal history of (healed) traumatic fracture: Secondary | ICD-10-CM | POA: Diagnosis not present

## 2015-12-11 DIAGNOSIS — M1991 Primary osteoarthritis, unspecified site: Secondary | ICD-10-CM | POA: Diagnosis not present

## 2015-12-11 DIAGNOSIS — M80052D Age-related osteoporosis with current pathological fracture, left femur, subsequent encounter for fracture with routine healing: Secondary | ICD-10-CM | POA: Diagnosis not present

## 2015-12-11 DIAGNOSIS — I2510 Atherosclerotic heart disease of native coronary artery without angina pectoris: Secondary | ICD-10-CM | POA: Diagnosis not present

## 2015-12-11 DIAGNOSIS — Z79891 Long term (current) use of opiate analgesic: Secondary | ICD-10-CM | POA: Diagnosis not present

## 2015-12-11 DIAGNOSIS — R42 Dizziness and giddiness: Secondary | ICD-10-CM | POA: Diagnosis not present

## 2015-12-11 DIAGNOSIS — I10 Essential (primary) hypertension: Secondary | ICD-10-CM | POA: Diagnosis not present

## 2015-12-11 DIAGNOSIS — J449 Chronic obstructive pulmonary disease, unspecified: Secondary | ICD-10-CM | POA: Diagnosis not present

## 2015-12-11 DIAGNOSIS — Z8781 Personal history of (healed) traumatic fracture: Secondary | ICD-10-CM | POA: Diagnosis not present

## 2015-12-11 DIAGNOSIS — Z9181 History of falling: Secondary | ICD-10-CM | POA: Diagnosis not present

## 2015-12-14 DIAGNOSIS — Z79891 Long term (current) use of opiate analgesic: Secondary | ICD-10-CM | POA: Diagnosis not present

## 2015-12-14 DIAGNOSIS — J449 Chronic obstructive pulmonary disease, unspecified: Secondary | ICD-10-CM | POA: Diagnosis not present

## 2015-12-14 DIAGNOSIS — I10 Essential (primary) hypertension: Secondary | ICD-10-CM | POA: Diagnosis not present

## 2015-12-14 DIAGNOSIS — Z9181 History of falling: Secondary | ICD-10-CM | POA: Diagnosis not present

## 2015-12-14 DIAGNOSIS — R42 Dizziness and giddiness: Secondary | ICD-10-CM | POA: Diagnosis not present

## 2015-12-14 DIAGNOSIS — M1991 Primary osteoarthritis, unspecified site: Secondary | ICD-10-CM | POA: Diagnosis not present

## 2015-12-14 DIAGNOSIS — Z8781 Personal history of (healed) traumatic fracture: Secondary | ICD-10-CM | POA: Diagnosis not present

## 2015-12-14 DIAGNOSIS — I2510 Atherosclerotic heart disease of native coronary artery without angina pectoris: Secondary | ICD-10-CM | POA: Diagnosis not present

## 2015-12-14 DIAGNOSIS — M80052D Age-related osteoporosis with current pathological fracture, left femur, subsequent encounter for fracture with routine healing: Secondary | ICD-10-CM | POA: Diagnosis not present

## 2015-12-15 DIAGNOSIS — Z9181 History of falling: Secondary | ICD-10-CM | POA: Diagnosis not present

## 2015-12-15 DIAGNOSIS — Z8781 Personal history of (healed) traumatic fracture: Secondary | ICD-10-CM | POA: Diagnosis not present

## 2015-12-15 DIAGNOSIS — Z79891 Long term (current) use of opiate analgesic: Secondary | ICD-10-CM | POA: Diagnosis not present

## 2015-12-15 DIAGNOSIS — J449 Chronic obstructive pulmonary disease, unspecified: Secondary | ICD-10-CM | POA: Diagnosis not present

## 2015-12-15 DIAGNOSIS — I10 Essential (primary) hypertension: Secondary | ICD-10-CM | POA: Diagnosis not present

## 2015-12-15 DIAGNOSIS — I2510 Atherosclerotic heart disease of native coronary artery without angina pectoris: Secondary | ICD-10-CM | POA: Diagnosis not present

## 2015-12-15 DIAGNOSIS — R42 Dizziness and giddiness: Secondary | ICD-10-CM | POA: Diagnosis not present

## 2015-12-15 DIAGNOSIS — M1991 Primary osteoarthritis, unspecified site: Secondary | ICD-10-CM | POA: Diagnosis not present

## 2015-12-15 DIAGNOSIS — M80052D Age-related osteoporosis with current pathological fracture, left femur, subsequent encounter for fracture with routine healing: Secondary | ICD-10-CM | POA: Diagnosis not present

## 2015-12-16 DIAGNOSIS — J449 Chronic obstructive pulmonary disease, unspecified: Secondary | ICD-10-CM | POA: Diagnosis not present

## 2015-12-16 DIAGNOSIS — Z9181 History of falling: Secondary | ICD-10-CM | POA: Diagnosis not present

## 2015-12-16 DIAGNOSIS — Z8781 Personal history of (healed) traumatic fracture: Secondary | ICD-10-CM | POA: Diagnosis not present

## 2015-12-16 DIAGNOSIS — Z79891 Long term (current) use of opiate analgesic: Secondary | ICD-10-CM | POA: Diagnosis not present

## 2015-12-16 DIAGNOSIS — I10 Essential (primary) hypertension: Secondary | ICD-10-CM | POA: Diagnosis not present

## 2015-12-16 DIAGNOSIS — I2510 Atherosclerotic heart disease of native coronary artery without angina pectoris: Secondary | ICD-10-CM | POA: Diagnosis not present

## 2015-12-16 DIAGNOSIS — R42 Dizziness and giddiness: Secondary | ICD-10-CM | POA: Diagnosis not present

## 2015-12-16 DIAGNOSIS — M1991 Primary osteoarthritis, unspecified site: Secondary | ICD-10-CM | POA: Diagnosis not present

## 2015-12-16 DIAGNOSIS — M80052D Age-related osteoporosis with current pathological fracture, left femur, subsequent encounter for fracture with routine healing: Secondary | ICD-10-CM | POA: Diagnosis not present

## 2015-12-17 DIAGNOSIS — M80052D Age-related osteoporosis with current pathological fracture, left femur, subsequent encounter for fracture with routine healing: Secondary | ICD-10-CM | POA: Diagnosis not present

## 2015-12-17 DIAGNOSIS — Z79891 Long term (current) use of opiate analgesic: Secondary | ICD-10-CM | POA: Diagnosis not present

## 2015-12-17 DIAGNOSIS — Z9181 History of falling: Secondary | ICD-10-CM | POA: Diagnosis not present

## 2015-12-17 DIAGNOSIS — J449 Chronic obstructive pulmonary disease, unspecified: Secondary | ICD-10-CM | POA: Diagnosis not present

## 2015-12-17 DIAGNOSIS — I2510 Atherosclerotic heart disease of native coronary artery without angina pectoris: Secondary | ICD-10-CM | POA: Diagnosis not present

## 2015-12-17 DIAGNOSIS — I10 Essential (primary) hypertension: Secondary | ICD-10-CM | POA: Diagnosis not present

## 2015-12-17 DIAGNOSIS — R42 Dizziness and giddiness: Secondary | ICD-10-CM | POA: Diagnosis not present

## 2015-12-17 DIAGNOSIS — Z8781 Personal history of (healed) traumatic fracture: Secondary | ICD-10-CM | POA: Diagnosis not present

## 2015-12-17 DIAGNOSIS — M1991 Primary osteoarthritis, unspecified site: Secondary | ICD-10-CM | POA: Diagnosis not present

## 2015-12-18 DIAGNOSIS — M80052D Age-related osteoporosis with current pathological fracture, left femur, subsequent encounter for fracture with routine healing: Secondary | ICD-10-CM | POA: Diagnosis not present

## 2015-12-18 DIAGNOSIS — I10 Essential (primary) hypertension: Secondary | ICD-10-CM | POA: Diagnosis not present

## 2015-12-18 DIAGNOSIS — M1991 Primary osteoarthritis, unspecified site: Secondary | ICD-10-CM | POA: Diagnosis not present

## 2015-12-18 DIAGNOSIS — Z9181 History of falling: Secondary | ICD-10-CM | POA: Diagnosis not present

## 2015-12-18 DIAGNOSIS — Z79891 Long term (current) use of opiate analgesic: Secondary | ICD-10-CM | POA: Diagnosis not present

## 2015-12-18 DIAGNOSIS — Z8781 Personal history of (healed) traumatic fracture: Secondary | ICD-10-CM | POA: Diagnosis not present

## 2015-12-18 DIAGNOSIS — R42 Dizziness and giddiness: Secondary | ICD-10-CM | POA: Diagnosis not present

## 2015-12-18 DIAGNOSIS — I2510 Atherosclerotic heart disease of native coronary artery without angina pectoris: Secondary | ICD-10-CM | POA: Diagnosis not present

## 2015-12-18 DIAGNOSIS — J449 Chronic obstructive pulmonary disease, unspecified: Secondary | ICD-10-CM | POA: Diagnosis not present

## 2015-12-21 DIAGNOSIS — I2510 Atherosclerotic heart disease of native coronary artery without angina pectoris: Secondary | ICD-10-CM | POA: Diagnosis not present

## 2015-12-21 DIAGNOSIS — M1991 Primary osteoarthritis, unspecified site: Secondary | ICD-10-CM | POA: Diagnosis not present

## 2015-12-21 DIAGNOSIS — R42 Dizziness and giddiness: Secondary | ICD-10-CM | POA: Diagnosis not present

## 2015-12-21 DIAGNOSIS — Z79891 Long term (current) use of opiate analgesic: Secondary | ICD-10-CM | POA: Diagnosis not present

## 2015-12-21 DIAGNOSIS — J449 Chronic obstructive pulmonary disease, unspecified: Secondary | ICD-10-CM | POA: Diagnosis not present

## 2015-12-21 DIAGNOSIS — Z9181 History of falling: Secondary | ICD-10-CM | POA: Diagnosis not present

## 2015-12-21 DIAGNOSIS — M80052D Age-related osteoporosis with current pathological fracture, left femur, subsequent encounter for fracture with routine healing: Secondary | ICD-10-CM | POA: Diagnosis not present

## 2015-12-21 DIAGNOSIS — I10 Essential (primary) hypertension: Secondary | ICD-10-CM | POA: Diagnosis not present

## 2015-12-21 DIAGNOSIS — Z8781 Personal history of (healed) traumatic fracture: Secondary | ICD-10-CM | POA: Diagnosis not present

## 2015-12-23 DIAGNOSIS — J449 Chronic obstructive pulmonary disease, unspecified: Secondary | ICD-10-CM | POA: Diagnosis not present

## 2015-12-23 DIAGNOSIS — Z8781 Personal history of (healed) traumatic fracture: Secondary | ICD-10-CM | POA: Diagnosis not present

## 2015-12-23 DIAGNOSIS — I2510 Atherosclerotic heart disease of native coronary artery without angina pectoris: Secondary | ICD-10-CM | POA: Diagnosis not present

## 2015-12-23 DIAGNOSIS — Z9181 History of falling: Secondary | ICD-10-CM | POA: Diagnosis not present

## 2015-12-23 DIAGNOSIS — M80052D Age-related osteoporosis with current pathological fracture, left femur, subsequent encounter for fracture with routine healing: Secondary | ICD-10-CM | POA: Diagnosis not present

## 2015-12-23 DIAGNOSIS — M1991 Primary osteoarthritis, unspecified site: Secondary | ICD-10-CM | POA: Diagnosis not present

## 2015-12-23 DIAGNOSIS — R42 Dizziness and giddiness: Secondary | ICD-10-CM | POA: Diagnosis not present

## 2015-12-23 DIAGNOSIS — I10 Essential (primary) hypertension: Secondary | ICD-10-CM | POA: Diagnosis not present

## 2015-12-23 DIAGNOSIS — Z79891 Long term (current) use of opiate analgesic: Secondary | ICD-10-CM | POA: Diagnosis not present

## 2015-12-24 DIAGNOSIS — Z8781 Personal history of (healed) traumatic fracture: Secondary | ICD-10-CM | POA: Diagnosis not present

## 2015-12-24 DIAGNOSIS — I10 Essential (primary) hypertension: Secondary | ICD-10-CM | POA: Diagnosis not present

## 2015-12-24 DIAGNOSIS — R42 Dizziness and giddiness: Secondary | ICD-10-CM | POA: Diagnosis not present

## 2015-12-24 DIAGNOSIS — M80052D Age-related osteoporosis with current pathological fracture, left femur, subsequent encounter for fracture with routine healing: Secondary | ICD-10-CM | POA: Diagnosis not present

## 2015-12-24 DIAGNOSIS — Z79891 Long term (current) use of opiate analgesic: Secondary | ICD-10-CM | POA: Diagnosis not present

## 2015-12-24 DIAGNOSIS — I2510 Atherosclerotic heart disease of native coronary artery without angina pectoris: Secondary | ICD-10-CM | POA: Diagnosis not present

## 2015-12-24 DIAGNOSIS — Z9181 History of falling: Secondary | ICD-10-CM | POA: Diagnosis not present

## 2015-12-24 DIAGNOSIS — M1991 Primary osteoarthritis, unspecified site: Secondary | ICD-10-CM | POA: Diagnosis not present

## 2015-12-24 DIAGNOSIS — J449 Chronic obstructive pulmonary disease, unspecified: Secondary | ICD-10-CM | POA: Diagnosis not present

## 2015-12-25 DIAGNOSIS — Z8781 Personal history of (healed) traumatic fracture: Secondary | ICD-10-CM | POA: Diagnosis not present

## 2015-12-25 DIAGNOSIS — M80052D Age-related osteoporosis with current pathological fracture, left femur, subsequent encounter for fracture with routine healing: Secondary | ICD-10-CM | POA: Diagnosis not present

## 2015-12-25 DIAGNOSIS — I2510 Atherosclerotic heart disease of native coronary artery without angina pectoris: Secondary | ICD-10-CM | POA: Diagnosis not present

## 2015-12-25 DIAGNOSIS — I10 Essential (primary) hypertension: Secondary | ICD-10-CM | POA: Diagnosis not present

## 2015-12-25 DIAGNOSIS — M1991 Primary osteoarthritis, unspecified site: Secondary | ICD-10-CM | POA: Diagnosis not present

## 2015-12-25 DIAGNOSIS — Z79891 Long term (current) use of opiate analgesic: Secondary | ICD-10-CM | POA: Diagnosis not present

## 2015-12-25 DIAGNOSIS — Z9181 History of falling: Secondary | ICD-10-CM | POA: Diagnosis not present

## 2015-12-25 DIAGNOSIS — R42 Dizziness and giddiness: Secondary | ICD-10-CM | POA: Diagnosis not present

## 2015-12-25 DIAGNOSIS — J449 Chronic obstructive pulmonary disease, unspecified: Secondary | ICD-10-CM | POA: Diagnosis not present

## 2015-12-28 DIAGNOSIS — I2510 Atherosclerotic heart disease of native coronary artery without angina pectoris: Secondary | ICD-10-CM | POA: Diagnosis not present

## 2015-12-28 DIAGNOSIS — J449 Chronic obstructive pulmonary disease, unspecified: Secondary | ICD-10-CM | POA: Diagnosis not present

## 2015-12-28 DIAGNOSIS — Z79891 Long term (current) use of opiate analgesic: Secondary | ICD-10-CM | POA: Diagnosis not present

## 2015-12-28 DIAGNOSIS — I10 Essential (primary) hypertension: Secondary | ICD-10-CM | POA: Diagnosis not present

## 2015-12-28 DIAGNOSIS — S82202D Unspecified fracture of shaft of left tibia, subsequent encounter for closed fracture with routine healing: Secondary | ICD-10-CM | POA: Diagnosis not present

## 2015-12-28 DIAGNOSIS — Z8781 Personal history of (healed) traumatic fracture: Secondary | ICD-10-CM | POA: Diagnosis not present

## 2015-12-28 DIAGNOSIS — Z9181 History of falling: Secondary | ICD-10-CM | POA: Diagnosis not present

## 2015-12-28 DIAGNOSIS — M1991 Primary osteoarthritis, unspecified site: Secondary | ICD-10-CM | POA: Diagnosis not present

## 2015-12-28 DIAGNOSIS — R42 Dizziness and giddiness: Secondary | ICD-10-CM | POA: Diagnosis not present

## 2015-12-28 DIAGNOSIS — M80052D Age-related osteoporosis with current pathological fracture, left femur, subsequent encounter for fracture with routine healing: Secondary | ICD-10-CM | POA: Diagnosis not present

## 2015-12-29 DIAGNOSIS — I10 Essential (primary) hypertension: Secondary | ICD-10-CM | POA: Diagnosis not present

## 2016-01-07 DIAGNOSIS — I10 Essential (primary) hypertension: Secondary | ICD-10-CM | POA: Diagnosis not present

## 2016-02-15 ENCOUNTER — Other Ambulatory Visit: Payer: Self-pay | Admitting: Thoracic Surgery (Cardiothoracic Vascular Surgery)

## 2016-02-15 DIAGNOSIS — R911 Solitary pulmonary nodule: Secondary | ICD-10-CM

## 2016-02-29 ENCOUNTER — Telehealth: Payer: Self-pay | Admitting: *Deleted

## 2016-03-09 DIAGNOSIS — N390 Urinary tract infection, site not specified: Secondary | ICD-10-CM | POA: Diagnosis not present

## 2016-03-09 DIAGNOSIS — I10 Essential (primary) hypertension: Secondary | ICD-10-CM | POA: Diagnosis not present

## 2016-03-15 ENCOUNTER — Other Ambulatory Visit: Payer: Commercial Managed Care - HMO

## 2016-03-15 ENCOUNTER — Ambulatory Visit: Payer: Commercial Managed Care - HMO | Admitting: Thoracic Surgery (Cardiothoracic Vascular Surgery)

## 2016-03-22 DIAGNOSIS — I10 Essential (primary) hypertension: Secondary | ICD-10-CM | POA: Diagnosis not present

## 2016-05-06 ENCOUNTER — Other Ambulatory Visit: Payer: Self-pay | Admitting: Thoracic Surgery (Cardiothoracic Vascular Surgery)

## 2016-05-06 DIAGNOSIS — R918 Other nonspecific abnormal finding of lung field: Secondary | ICD-10-CM

## 2016-05-23 ENCOUNTER — Other Ambulatory Visit: Payer: Commercial Managed Care - HMO

## 2016-05-24 ENCOUNTER — Ambulatory Visit: Payer: Commercial Managed Care - HMO | Admitting: Thoracic Surgery (Cardiothoracic Vascular Surgery)

## 2016-06-06 DIAGNOSIS — I10 Essential (primary) hypertension: Secondary | ICD-10-CM | POA: Diagnosis not present

## 2016-06-06 DIAGNOSIS — N3946 Mixed incontinence: Secondary | ICD-10-CM | POA: Diagnosis not present

## 2016-06-06 DIAGNOSIS — N3000 Acute cystitis without hematuria: Secondary | ICD-10-CM | POA: Diagnosis not present

## 2016-06-06 DIAGNOSIS — Z79899 Other long term (current) drug therapy: Secondary | ICD-10-CM | POA: Diagnosis not present

## 2016-06-08 DIAGNOSIS — N390 Urinary tract infection, site not specified: Secondary | ICD-10-CM | POA: Diagnosis not present

## 2016-06-08 DIAGNOSIS — I10 Essential (primary) hypertension: Secondary | ICD-10-CM | POA: Diagnosis not present

## 2016-06-08 DIAGNOSIS — Z79899 Other long term (current) drug therapy: Secondary | ICD-10-CM | POA: Diagnosis not present

## 2016-10-06 DIAGNOSIS — R42 Dizziness and giddiness: Secondary | ICD-10-CM | POA: Diagnosis not present

## 2016-10-06 DIAGNOSIS — R252 Cramp and spasm: Secondary | ICD-10-CM | POA: Diagnosis not present

## 2016-10-06 DIAGNOSIS — I10 Essential (primary) hypertension: Secondary | ICD-10-CM | POA: Diagnosis not present

## 2017-01-10 DIAGNOSIS — R300 Dysuria: Secondary | ICD-10-CM | POA: Diagnosis not present

## 2017-08-17 DIAGNOSIS — F0390 Unspecified dementia without behavioral disturbance: Secondary | ICD-10-CM | POA: Diagnosis not present

## 2017-08-17 DIAGNOSIS — S3992XA Unspecified injury of lower back, initial encounter: Secondary | ICD-10-CM | POA: Diagnosis not present

## 2017-08-17 DIAGNOSIS — R102 Pelvic and perineal pain: Secondary | ICD-10-CM | POA: Diagnosis not present

## 2017-08-17 DIAGNOSIS — G4489 Other headache syndrome: Secondary | ICD-10-CM | POA: Diagnosis not present

## 2017-08-17 DIAGNOSIS — M549 Dorsalgia, unspecified: Secondary | ICD-10-CM | POA: Diagnosis not present

## 2017-08-17 DIAGNOSIS — R296 Repeated falls: Secondary | ICD-10-CM | POA: Diagnosis not present

## 2017-08-17 DIAGNOSIS — T148XXA Other injury of unspecified body region, initial encounter: Secondary | ICD-10-CM | POA: Diagnosis not present

## 2017-08-17 DIAGNOSIS — S32030A Wedge compression fracture of third lumbar vertebra, initial encounter for closed fracture: Secondary | ICD-10-CM | POA: Diagnosis not present

## 2017-08-17 DIAGNOSIS — S3993XA Unspecified injury of pelvis, initial encounter: Secondary | ICD-10-CM | POA: Diagnosis not present

## 2017-08-17 DIAGNOSIS — R55 Syncope and collapse: Secondary | ICD-10-CM | POA: Diagnosis not present

## 2017-08-17 DIAGNOSIS — S299XXA Unspecified injury of thorax, initial encounter: Secondary | ICD-10-CM | POA: Diagnosis not present

## 2017-10-10 DIAGNOSIS — R413 Other amnesia: Secondary | ICD-10-CM | POA: Diagnosis not present

## 2017-10-10 DIAGNOSIS — E559 Vitamin D deficiency, unspecified: Secondary | ICD-10-CM | POA: Diagnosis not present

## 2017-10-10 DIAGNOSIS — M810 Age-related osteoporosis without current pathological fracture: Secondary | ICD-10-CM | POA: Diagnosis not present

## 2017-10-10 DIAGNOSIS — R5383 Other fatigue: Secondary | ICD-10-CM | POA: Diagnosis not present

## 2017-10-10 DIAGNOSIS — R636 Underweight: Secondary | ICD-10-CM | POA: Diagnosis not present

## 2017-10-10 DIAGNOSIS — M8000XA Age-related osteoporosis with current pathological fracture, unspecified site, initial encounter for fracture: Secondary | ICD-10-CM | POA: Diagnosis not present

## 2017-11-20 DIAGNOSIS — S52124A Nondisplaced fracture of head of right radius, initial encounter for closed fracture: Secondary | ICD-10-CM | POA: Diagnosis not present

## 2017-11-20 DIAGNOSIS — S52501A Unspecified fracture of the lower end of right radius, initial encounter for closed fracture: Secondary | ICD-10-CM | POA: Diagnosis not present

## 2017-11-20 DIAGNOSIS — S42211A Unspecified displaced fracture of surgical neck of right humerus, initial encounter for closed fracture: Secondary | ICD-10-CM | POA: Diagnosis not present

## 2017-11-20 DIAGNOSIS — S42294A Other nondisplaced fracture of upper end of right humerus, initial encounter for closed fracture: Secondary | ICD-10-CM | POA: Diagnosis not present

## 2017-11-20 DIAGNOSIS — S6991XA Unspecified injury of right wrist, hand and finger(s), initial encounter: Secondary | ICD-10-CM | POA: Diagnosis not present

## 2017-11-20 DIAGNOSIS — M79621 Pain in right upper arm: Secondary | ICD-10-CM | POA: Diagnosis not present

## 2017-11-21 DIAGNOSIS — S42214A Unspecified nondisplaced fracture of surgical neck of right humerus, initial encounter for closed fracture: Secondary | ICD-10-CM | POA: Diagnosis not present

## 2018-01-02 DIAGNOSIS — R634 Abnormal weight loss: Secondary | ICD-10-CM | POA: Diagnosis not present

## 2018-01-02 DIAGNOSIS — S42211G Unspecified displaced fracture of surgical neck of right humerus, subsequent encounter for fracture with delayed healing: Secondary | ICD-10-CM | POA: Diagnosis not present

## 2018-01-02 DIAGNOSIS — G309 Alzheimer's disease, unspecified: Secondary | ICD-10-CM | POA: Diagnosis not present

## 2018-01-02 DIAGNOSIS — R296 Repeated falls: Secondary | ICD-10-CM | POA: Diagnosis not present

## 2018-01-02 DIAGNOSIS — S52591G Other fractures of lower end of right radius, subsequent encounter for closed fracture with delayed healing: Secondary | ICD-10-CM | POA: Diagnosis not present

## 2018-01-02 DIAGNOSIS — M79601 Pain in right arm: Secondary | ICD-10-CM | POA: Diagnosis not present

## 2018-01-02 DIAGNOSIS — S52611G Displaced fracture of right ulna styloid process, subsequent encounter for closed fracture with delayed healing: Secondary | ICD-10-CM | POA: Diagnosis not present

## 2018-01-02 DIAGNOSIS — S42224A 2-part nondisplaced fracture of surgical neck of right humerus, initial encounter for closed fracture: Secondary | ICD-10-CM | POA: Diagnosis not present

## 2018-01-04 DIAGNOSIS — M810 Age-related osteoporosis without current pathological fracture: Secondary | ICD-10-CM | POA: Diagnosis not present

## 2018-01-04 DIAGNOSIS — E2839 Other primary ovarian failure: Secondary | ICD-10-CM | POA: Diagnosis not present

## 2018-01-08 DIAGNOSIS — G309 Alzheimer's disease, unspecified: Secondary | ICD-10-CM | POA: Diagnosis not present

## 2018-01-08 DIAGNOSIS — S42224D 2-part nondisplaced fracture of surgical neck of right humerus, subsequent encounter for fracture with routine healing: Secondary | ICD-10-CM | POA: Diagnosis not present

## 2018-01-08 DIAGNOSIS — R634 Abnormal weight loss: Secondary | ICD-10-CM | POA: Diagnosis not present

## 2018-01-08 DIAGNOSIS — R296 Repeated falls: Secondary | ICD-10-CM | POA: Diagnosis not present

## 2018-01-08 DIAGNOSIS — Z87310 Personal history of (healed) osteoporosis fracture: Secondary | ICD-10-CM | POA: Diagnosis not present

## 2018-01-08 DIAGNOSIS — F0280 Dementia in other diseases classified elsewhere without behavioral disturbance: Secondary | ICD-10-CM | POA: Diagnosis not present

## 2018-01-08 DIAGNOSIS — M79601 Pain in right arm: Secondary | ICD-10-CM | POA: Diagnosis not present

## 2018-01-09 DIAGNOSIS — M79601 Pain in right arm: Secondary | ICD-10-CM | POA: Diagnosis not present

## 2018-01-09 DIAGNOSIS — G309 Alzheimer's disease, unspecified: Secondary | ICD-10-CM | POA: Diagnosis not present

## 2018-01-09 DIAGNOSIS — F0280 Dementia in other diseases classified elsewhere without behavioral disturbance: Secondary | ICD-10-CM | POA: Diagnosis not present

## 2018-01-09 DIAGNOSIS — Z87310 Personal history of (healed) osteoporosis fracture: Secondary | ICD-10-CM | POA: Diagnosis not present

## 2018-01-09 DIAGNOSIS — S42224D 2-part nondisplaced fracture of surgical neck of right humerus, subsequent encounter for fracture with routine healing: Secondary | ICD-10-CM | POA: Diagnosis not present

## 2018-01-09 DIAGNOSIS — R296 Repeated falls: Secondary | ICD-10-CM | POA: Diagnosis not present

## 2018-01-09 DIAGNOSIS — R634 Abnormal weight loss: Secondary | ICD-10-CM | POA: Diagnosis not present

## 2018-01-10 DIAGNOSIS — M79601 Pain in right arm: Secondary | ICD-10-CM | POA: Diagnosis not present

## 2018-01-10 DIAGNOSIS — S42224D 2-part nondisplaced fracture of surgical neck of right humerus, subsequent encounter for fracture with routine healing: Secondary | ICD-10-CM | POA: Diagnosis not present

## 2018-01-10 DIAGNOSIS — F0280 Dementia in other diseases classified elsewhere without behavioral disturbance: Secondary | ICD-10-CM | POA: Diagnosis not present

## 2018-01-10 DIAGNOSIS — R634 Abnormal weight loss: Secondary | ICD-10-CM | POA: Diagnosis not present

## 2018-01-10 DIAGNOSIS — G309 Alzheimer's disease, unspecified: Secondary | ICD-10-CM | POA: Diagnosis not present

## 2018-01-10 DIAGNOSIS — R296 Repeated falls: Secondary | ICD-10-CM | POA: Diagnosis not present

## 2018-01-10 DIAGNOSIS — Z87310 Personal history of (healed) osteoporosis fracture: Secondary | ICD-10-CM | POA: Diagnosis not present

## 2018-01-12 DIAGNOSIS — S42224D 2-part nondisplaced fracture of surgical neck of right humerus, subsequent encounter for fracture with routine healing: Secondary | ICD-10-CM | POA: Diagnosis not present

## 2018-01-12 DIAGNOSIS — G309 Alzheimer's disease, unspecified: Secondary | ICD-10-CM | POA: Diagnosis not present

## 2018-01-12 DIAGNOSIS — R296 Repeated falls: Secondary | ICD-10-CM | POA: Diagnosis not present

## 2018-01-12 DIAGNOSIS — Z87310 Personal history of (healed) osteoporosis fracture: Secondary | ICD-10-CM | POA: Diagnosis not present

## 2018-01-12 DIAGNOSIS — M79601 Pain in right arm: Secondary | ICD-10-CM | POA: Diagnosis not present

## 2018-01-12 DIAGNOSIS — F0280 Dementia in other diseases classified elsewhere without behavioral disturbance: Secondary | ICD-10-CM | POA: Diagnosis not present

## 2018-01-12 DIAGNOSIS — R634 Abnormal weight loss: Secondary | ICD-10-CM | POA: Diagnosis not present

## 2018-01-16 DIAGNOSIS — M79601 Pain in right arm: Secondary | ICD-10-CM | POA: Diagnosis not present

## 2018-01-16 DIAGNOSIS — F0280 Dementia in other diseases classified elsewhere without behavioral disturbance: Secondary | ICD-10-CM | POA: Diagnosis not present

## 2018-01-16 DIAGNOSIS — G309 Alzheimer's disease, unspecified: Secondary | ICD-10-CM | POA: Diagnosis not present

## 2018-01-16 DIAGNOSIS — Z87310 Personal history of (healed) osteoporosis fracture: Secondary | ICD-10-CM | POA: Diagnosis not present

## 2018-01-16 DIAGNOSIS — R296 Repeated falls: Secondary | ICD-10-CM | POA: Diagnosis not present

## 2018-01-16 DIAGNOSIS — R634 Abnormal weight loss: Secondary | ICD-10-CM | POA: Diagnosis not present

## 2018-01-16 DIAGNOSIS — S42224D 2-part nondisplaced fracture of surgical neck of right humerus, subsequent encounter for fracture with routine healing: Secondary | ICD-10-CM | POA: Diagnosis not present

## 2018-01-18 DIAGNOSIS — M79601 Pain in right arm: Secondary | ICD-10-CM | POA: Diagnosis not present

## 2018-01-18 DIAGNOSIS — S42224D 2-part nondisplaced fracture of surgical neck of right humerus, subsequent encounter for fracture with routine healing: Secondary | ICD-10-CM | POA: Diagnosis not present

## 2018-01-18 DIAGNOSIS — G309 Alzheimer's disease, unspecified: Secondary | ICD-10-CM | POA: Diagnosis not present

## 2018-01-18 DIAGNOSIS — R296 Repeated falls: Secondary | ICD-10-CM | POA: Diagnosis not present

## 2018-01-18 DIAGNOSIS — R634 Abnormal weight loss: Secondary | ICD-10-CM | POA: Diagnosis not present

## 2018-01-18 DIAGNOSIS — Z87310 Personal history of (healed) osteoporosis fracture: Secondary | ICD-10-CM | POA: Diagnosis not present

## 2018-01-18 DIAGNOSIS — F0280 Dementia in other diseases classified elsewhere without behavioral disturbance: Secondary | ICD-10-CM | POA: Diagnosis not present

## 2018-01-23 DIAGNOSIS — M79601 Pain in right arm: Secondary | ICD-10-CM | POA: Diagnosis not present

## 2018-01-23 DIAGNOSIS — F0280 Dementia in other diseases classified elsewhere without behavioral disturbance: Secondary | ICD-10-CM | POA: Diagnosis not present

## 2018-01-23 DIAGNOSIS — R634 Abnormal weight loss: Secondary | ICD-10-CM | POA: Diagnosis not present

## 2018-01-23 DIAGNOSIS — G309 Alzheimer's disease, unspecified: Secondary | ICD-10-CM | POA: Diagnosis not present

## 2018-01-23 DIAGNOSIS — S42224D 2-part nondisplaced fracture of surgical neck of right humerus, subsequent encounter for fracture with routine healing: Secondary | ICD-10-CM | POA: Diagnosis not present

## 2018-01-23 DIAGNOSIS — R296 Repeated falls: Secondary | ICD-10-CM | POA: Diagnosis not present

## 2018-01-23 DIAGNOSIS — Z87310 Personal history of (healed) osteoporosis fracture: Secondary | ICD-10-CM | POA: Diagnosis not present

## 2018-01-25 DIAGNOSIS — Z87310 Personal history of (healed) osteoporosis fracture: Secondary | ICD-10-CM | POA: Diagnosis not present

## 2018-01-25 DIAGNOSIS — R634 Abnormal weight loss: Secondary | ICD-10-CM | POA: Diagnosis not present

## 2018-01-25 DIAGNOSIS — F0280 Dementia in other diseases classified elsewhere without behavioral disturbance: Secondary | ICD-10-CM | POA: Diagnosis not present

## 2018-01-25 DIAGNOSIS — R296 Repeated falls: Secondary | ICD-10-CM | POA: Diagnosis not present

## 2018-01-25 DIAGNOSIS — M79601 Pain in right arm: Secondary | ICD-10-CM | POA: Diagnosis not present

## 2018-01-25 DIAGNOSIS — G309 Alzheimer's disease, unspecified: Secondary | ICD-10-CM | POA: Diagnosis not present

## 2018-01-25 DIAGNOSIS — S42224D 2-part nondisplaced fracture of surgical neck of right humerus, subsequent encounter for fracture with routine healing: Secondary | ICD-10-CM | POA: Diagnosis not present

## 2018-01-26 DIAGNOSIS — R296 Repeated falls: Secondary | ICD-10-CM | POA: Diagnosis not present

## 2018-01-26 DIAGNOSIS — R634 Abnormal weight loss: Secondary | ICD-10-CM | POA: Diagnosis not present

## 2018-01-26 DIAGNOSIS — M79601 Pain in right arm: Secondary | ICD-10-CM | POA: Diagnosis not present

## 2018-01-26 DIAGNOSIS — G309 Alzheimer's disease, unspecified: Secondary | ICD-10-CM | POA: Diagnosis not present

## 2018-01-26 DIAGNOSIS — S42224D 2-part nondisplaced fracture of surgical neck of right humerus, subsequent encounter for fracture with routine healing: Secondary | ICD-10-CM | POA: Diagnosis not present

## 2018-01-26 DIAGNOSIS — F0280 Dementia in other diseases classified elsewhere without behavioral disturbance: Secondary | ICD-10-CM | POA: Diagnosis not present

## 2018-01-26 DIAGNOSIS — Z87310 Personal history of (healed) osteoporosis fracture: Secondary | ICD-10-CM | POA: Diagnosis not present

## 2018-01-30 DIAGNOSIS — R634 Abnormal weight loss: Secondary | ICD-10-CM | POA: Diagnosis not present

## 2018-01-30 DIAGNOSIS — G309 Alzheimer's disease, unspecified: Secondary | ICD-10-CM | POA: Diagnosis not present

## 2018-01-30 DIAGNOSIS — F0280 Dementia in other diseases classified elsewhere without behavioral disturbance: Secondary | ICD-10-CM | POA: Diagnosis not present

## 2018-01-30 DIAGNOSIS — Z87310 Personal history of (healed) osteoporosis fracture: Secondary | ICD-10-CM | POA: Diagnosis not present

## 2018-01-30 DIAGNOSIS — S42224D 2-part nondisplaced fracture of surgical neck of right humerus, subsequent encounter for fracture with routine healing: Secondary | ICD-10-CM | POA: Diagnosis not present

## 2018-01-30 DIAGNOSIS — M79601 Pain in right arm: Secondary | ICD-10-CM | POA: Diagnosis not present

## 2018-01-30 DIAGNOSIS — R296 Repeated falls: Secondary | ICD-10-CM | POA: Diagnosis not present

## 2018-02-01 DIAGNOSIS — F0280 Dementia in other diseases classified elsewhere without behavioral disturbance: Secondary | ICD-10-CM | POA: Diagnosis not present

## 2018-02-01 DIAGNOSIS — S42224D 2-part nondisplaced fracture of surgical neck of right humerus, subsequent encounter for fracture with routine healing: Secondary | ICD-10-CM | POA: Diagnosis not present

## 2018-02-01 DIAGNOSIS — R634 Abnormal weight loss: Secondary | ICD-10-CM | POA: Diagnosis not present

## 2018-02-01 DIAGNOSIS — M79601 Pain in right arm: Secondary | ICD-10-CM | POA: Diagnosis not present

## 2018-02-01 DIAGNOSIS — G309 Alzheimer's disease, unspecified: Secondary | ICD-10-CM | POA: Diagnosis not present

## 2018-02-01 DIAGNOSIS — Z87310 Personal history of (healed) osteoporosis fracture: Secondary | ICD-10-CM | POA: Diagnosis not present

## 2018-02-01 DIAGNOSIS — R296 Repeated falls: Secondary | ICD-10-CM | POA: Diagnosis not present

## 2018-02-08 DIAGNOSIS — R296 Repeated falls: Secondary | ICD-10-CM | POA: Diagnosis not present

## 2018-02-08 DIAGNOSIS — S42224D 2-part nondisplaced fracture of surgical neck of right humerus, subsequent encounter for fracture with routine healing: Secondary | ICD-10-CM | POA: Diagnosis not present

## 2018-02-08 DIAGNOSIS — Z87310 Personal history of (healed) osteoporosis fracture: Secondary | ICD-10-CM | POA: Diagnosis not present

## 2018-02-08 DIAGNOSIS — M79601 Pain in right arm: Secondary | ICD-10-CM | POA: Diagnosis not present

## 2018-02-08 DIAGNOSIS — F0280 Dementia in other diseases classified elsewhere without behavioral disturbance: Secondary | ICD-10-CM | POA: Diagnosis not present

## 2018-02-08 DIAGNOSIS — R634 Abnormal weight loss: Secondary | ICD-10-CM | POA: Diagnosis not present

## 2018-02-08 DIAGNOSIS — G309 Alzheimer's disease, unspecified: Secondary | ICD-10-CM | POA: Diagnosis not present

## 2019-08-04 DIAGNOSIS — N390 Urinary tract infection, site not specified: Secondary | ICD-10-CM | POA: Diagnosis not present

## 2019-08-04 DIAGNOSIS — Z03818 Encounter for observation for suspected exposure to other biological agents ruled out: Secondary | ICD-10-CM | POA: Diagnosis not present

## 2019-08-04 DIAGNOSIS — M25572 Pain in left ankle and joints of left foot: Secondary | ICD-10-CM | POA: Diagnosis not present

## 2019-08-04 DIAGNOSIS — R52 Pain, unspecified: Secondary | ICD-10-CM | POA: Diagnosis not present

## 2019-08-04 DIAGNOSIS — I10 Essential (primary) hypertension: Secondary | ICD-10-CM | POA: Diagnosis not present

## 2019-08-04 DIAGNOSIS — M810 Age-related osteoporosis without current pathological fracture: Secondary | ICD-10-CM | POA: Diagnosis not present

## 2019-08-04 DIAGNOSIS — I2510 Atherosclerotic heart disease of native coronary artery without angina pectoris: Secondary | ICD-10-CM | POA: Diagnosis not present

## 2019-08-04 DIAGNOSIS — S72031A Displaced midcervical fracture of right femur, initial encounter for closed fracture: Secondary | ICD-10-CM | POA: Diagnosis not present

## 2019-08-04 DIAGNOSIS — M1611 Unilateral primary osteoarthritis, right hip: Secondary | ICD-10-CM | POA: Diagnosis not present

## 2019-08-04 DIAGNOSIS — S72011A Unspecified intracapsular fracture of right femur, initial encounter for closed fracture: Secondary | ICD-10-CM | POA: Diagnosis not present

## 2019-08-04 DIAGNOSIS — R5381 Other malaise: Secondary | ICD-10-CM | POA: Diagnosis not present

## 2019-08-04 DIAGNOSIS — N3001 Acute cystitis with hematuria: Secondary | ICD-10-CM | POA: Diagnosis not present

## 2019-08-04 DIAGNOSIS — I160 Hypertensive urgency: Secondary | ICD-10-CM | POA: Diagnosis not present

## 2019-08-04 DIAGNOSIS — Z96641 Presence of right artificial hip joint: Secondary | ICD-10-CM | POA: Diagnosis not present

## 2019-08-04 DIAGNOSIS — Z471 Aftercare following joint replacement surgery: Secondary | ICD-10-CM | POA: Diagnosis not present

## 2019-08-04 DIAGNOSIS — Z23 Encounter for immunization: Secondary | ICD-10-CM | POA: Diagnosis not present

## 2019-08-04 DIAGNOSIS — E43 Unspecified severe protein-calorie malnutrition: Secondary | ICD-10-CM | POA: Diagnosis not present

## 2019-08-04 DIAGNOSIS — J449 Chronic obstructive pulmonary disease, unspecified: Secondary | ICD-10-CM | POA: Diagnosis not present

## 2019-08-04 DIAGNOSIS — S72041A Displaced fracture of base of neck of right femur, initial encounter for closed fracture: Secondary | ICD-10-CM | POA: Diagnosis not present

## 2019-08-04 DIAGNOSIS — S199XXA Unspecified injury of neck, initial encounter: Secondary | ICD-10-CM | POA: Diagnosis not present

## 2019-08-04 DIAGNOSIS — S72144D Nondisplaced intertrochanteric fracture of right femur, subsequent encounter for closed fracture with routine healing: Secondary | ICD-10-CM | POA: Diagnosis not present

## 2019-08-04 DIAGNOSIS — K219 Gastro-esophageal reflux disease without esophagitis: Secondary | ICD-10-CM | POA: Diagnosis not present

## 2019-08-04 DIAGNOSIS — B952 Enterococcus as the cause of diseases classified elsewhere: Secondary | ICD-10-CM | POA: Diagnosis not present

## 2019-08-04 DIAGNOSIS — D62 Acute posthemorrhagic anemia: Secondary | ICD-10-CM | POA: Diagnosis not present

## 2019-08-04 DIAGNOSIS — S0990XA Unspecified injury of head, initial encounter: Secondary | ICD-10-CM | POA: Diagnosis not present

## 2019-08-04 DIAGNOSIS — I480 Paroxysmal atrial fibrillation: Secondary | ICD-10-CM | POA: Diagnosis not present

## 2019-08-04 DIAGNOSIS — R69 Illness, unspecified: Secondary | ICD-10-CM | POA: Diagnosis not present

## 2019-08-04 DIAGNOSIS — S72001A Fracture of unspecified part of neck of right femur, initial encounter for closed fracture: Secondary | ICD-10-CM | POA: Diagnosis not present

## 2019-08-04 DIAGNOSIS — S42001A Fracture of unspecified part of right clavicle, initial encounter for closed fracture: Secondary | ICD-10-CM | POA: Diagnosis not present

## 2019-08-04 DIAGNOSIS — Z681 Body mass index (BMI) 19 or less, adult: Secondary | ICD-10-CM | POA: Diagnosis not present

## 2019-08-04 DIAGNOSIS — S72002A Fracture of unspecified part of neck of left femur, initial encounter for closed fracture: Secondary | ICD-10-CM | POA: Diagnosis not present

## 2019-08-04 DIAGNOSIS — M25551 Pain in right hip: Secondary | ICD-10-CM | POA: Diagnosis not present

## 2019-08-04 DIAGNOSIS — W19XXXA Unspecified fall, initial encounter: Secondary | ICD-10-CM | POA: Diagnosis not present

## 2019-08-04 DIAGNOSIS — S299XXA Unspecified injury of thorax, initial encounter: Secondary | ICD-10-CM | POA: Diagnosis not present

## 2019-08-04 DIAGNOSIS — M84651A Pathological fracture in other disease, right femur, initial encounter for fracture: Secondary | ICD-10-CM | POA: Diagnosis not present

## 2019-08-04 DIAGNOSIS — S42001D Fracture of unspecified part of right clavicle, subsequent encounter for fracture with routine healing: Secondary | ICD-10-CM | POA: Diagnosis not present

## 2019-08-04 DIAGNOSIS — S42031A Displaced fracture of lateral end of right clavicle, initial encounter for closed fracture: Secondary | ICD-10-CM | POA: Diagnosis not present

## 2019-08-05 DIAGNOSIS — I160 Hypertensive urgency: Secondary | ICD-10-CM | POA: Diagnosis not present

## 2019-08-05 DIAGNOSIS — S72011A Unspecified intracapsular fracture of right femur, initial encounter for closed fracture: Secondary | ICD-10-CM | POA: Diagnosis not present

## 2019-08-05 DIAGNOSIS — S72031A Displaced midcervical fracture of right femur, initial encounter for closed fracture: Secondary | ICD-10-CM | POA: Diagnosis not present

## 2019-08-05 DIAGNOSIS — Z96641 Presence of right artificial hip joint: Secondary | ICD-10-CM | POA: Diagnosis not present

## 2019-08-05 DIAGNOSIS — M1611 Unilateral primary osteoarthritis, right hip: Secondary | ICD-10-CM | POA: Diagnosis not present

## 2019-08-05 DIAGNOSIS — M84651A Pathological fracture in other disease, right femur, initial encounter for fracture: Secondary | ICD-10-CM | POA: Diagnosis not present

## 2019-08-05 DIAGNOSIS — Z471 Aftercare following joint replacement surgery: Secondary | ICD-10-CM | POA: Diagnosis not present

## 2019-08-05 DIAGNOSIS — J449 Chronic obstructive pulmonary disease, unspecified: Secondary | ICD-10-CM | POA: Diagnosis not present

## 2019-08-05 DIAGNOSIS — I10 Essential (primary) hypertension: Secondary | ICD-10-CM | POA: Diagnosis not present

## 2019-08-05 DIAGNOSIS — S42001A Fracture of unspecified part of right clavicle, initial encounter for closed fracture: Secondary | ICD-10-CM | POA: Diagnosis not present

## 2019-08-05 DIAGNOSIS — K219 Gastro-esophageal reflux disease without esophagitis: Secondary | ICD-10-CM | POA: Diagnosis not present

## 2019-08-06 DIAGNOSIS — K219 Gastro-esophageal reflux disease without esophagitis: Secondary | ICD-10-CM | POA: Diagnosis not present

## 2019-08-06 DIAGNOSIS — S72031A Displaced midcervical fracture of right femur, initial encounter for closed fracture: Secondary | ICD-10-CM | POA: Diagnosis not present

## 2019-08-06 DIAGNOSIS — I10 Essential (primary) hypertension: Secondary | ICD-10-CM | POA: Diagnosis not present

## 2019-08-06 DIAGNOSIS — I160 Hypertensive urgency: Secondary | ICD-10-CM | POA: Diagnosis not present

## 2019-08-06 DIAGNOSIS — S42001A Fracture of unspecified part of right clavicle, initial encounter for closed fracture: Secondary | ICD-10-CM | POA: Diagnosis not present

## 2019-08-07 DIAGNOSIS — I160 Hypertensive urgency: Secondary | ICD-10-CM | POA: Diagnosis not present

## 2019-08-07 DIAGNOSIS — K219 Gastro-esophageal reflux disease without esophagitis: Secondary | ICD-10-CM | POA: Diagnosis not present

## 2019-08-07 DIAGNOSIS — I10 Essential (primary) hypertension: Secondary | ICD-10-CM | POA: Diagnosis not present

## 2019-08-07 DIAGNOSIS — S42001A Fracture of unspecified part of right clavicle, initial encounter for closed fracture: Secondary | ICD-10-CM | POA: Diagnosis not present

## 2019-08-07 DIAGNOSIS — S72031A Displaced midcervical fracture of right femur, initial encounter for closed fracture: Secondary | ICD-10-CM | POA: Diagnosis not present

## 2019-08-08 DIAGNOSIS — I10 Essential (primary) hypertension: Secondary | ICD-10-CM | POA: Diagnosis not present

## 2019-08-08 DIAGNOSIS — K219 Gastro-esophageal reflux disease without esophagitis: Secondary | ICD-10-CM | POA: Diagnosis not present

## 2019-08-08 DIAGNOSIS — S42001A Fracture of unspecified part of right clavicle, initial encounter for closed fracture: Secondary | ICD-10-CM | POA: Diagnosis not present

## 2019-08-08 DIAGNOSIS — I160 Hypertensive urgency: Secondary | ICD-10-CM | POA: Diagnosis not present

## 2019-08-08 DIAGNOSIS — S72031A Displaced midcervical fracture of right femur, initial encounter for closed fracture: Secondary | ICD-10-CM | POA: Diagnosis not present

## 2019-08-09 DIAGNOSIS — G309 Alzheimer's disease, unspecified: Secondary | ICD-10-CM | POA: Diagnosis not present

## 2019-08-09 DIAGNOSIS — R5381 Other malaise: Secondary | ICD-10-CM | POA: Diagnosis not present

## 2019-08-09 DIAGNOSIS — N3001 Acute cystitis with hematuria: Secondary | ICD-10-CM | POA: Diagnosis not present

## 2019-08-09 DIAGNOSIS — N390 Urinary tract infection, site not specified: Secondary | ICD-10-CM | POA: Diagnosis not present

## 2019-08-09 DIAGNOSIS — I480 Paroxysmal atrial fibrillation: Secondary | ICD-10-CM | POA: Diagnosis not present

## 2019-08-09 DIAGNOSIS — S72002A Fracture of unspecified part of neck of left femur, initial encounter for closed fracture: Secondary | ICD-10-CM | POA: Diagnosis not present

## 2019-08-09 DIAGNOSIS — S42031A Displaced fracture of lateral end of right clavicle, initial encounter for closed fracture: Secondary | ICD-10-CM | POA: Diagnosis not present

## 2019-08-09 DIAGNOSIS — J449 Chronic obstructive pulmonary disease, unspecified: Secondary | ICD-10-CM | POA: Diagnosis not present

## 2019-08-09 DIAGNOSIS — D62 Acute posthemorrhagic anemia: Secondary | ICD-10-CM | POA: Diagnosis not present

## 2019-08-09 DIAGNOSIS — K219 Gastro-esophageal reflux disease without esophagitis: Secondary | ICD-10-CM | POA: Diagnosis not present

## 2019-08-09 DIAGNOSIS — S72031A Displaced midcervical fracture of right femur, initial encounter for closed fracture: Secondary | ICD-10-CM | POA: Diagnosis not present

## 2019-08-09 DIAGNOSIS — I160 Hypertensive urgency: Secondary | ICD-10-CM | POA: Diagnosis not present

## 2019-08-09 DIAGNOSIS — S72144D Nondisplaced intertrochanteric fracture of right femur, subsequent encounter for closed fracture with routine healing: Secondary | ICD-10-CM | POA: Diagnosis not present

## 2019-08-09 DIAGNOSIS — B952 Enterococcus as the cause of diseases classified elsewhere: Secondary | ICD-10-CM | POA: Diagnosis not present

## 2019-08-09 DIAGNOSIS — Z23 Encounter for immunization: Secondary | ICD-10-CM | POA: Diagnosis not present

## 2019-08-09 DIAGNOSIS — R69 Illness, unspecified: Secondary | ICD-10-CM | POA: Diagnosis not present

## 2019-08-09 DIAGNOSIS — I10 Essential (primary) hypertension: Secondary | ICD-10-CM | POA: Diagnosis not present

## 2019-08-09 DIAGNOSIS — N3000 Acute cystitis without hematuria: Secondary | ICD-10-CM | POA: Diagnosis not present

## 2019-08-09 DIAGNOSIS — S42001D Fracture of unspecified part of right clavicle, subsequent encounter for fracture with routine healing: Secondary | ICD-10-CM | POA: Diagnosis not present

## 2019-08-09 DIAGNOSIS — S42001A Fracture of unspecified part of right clavicle, initial encounter for closed fracture: Secondary | ICD-10-CM | POA: Diagnosis not present

## 2019-08-09 DIAGNOSIS — M810 Age-related osteoporosis without current pathological fracture: Secondary | ICD-10-CM | POA: Diagnosis not present

## 2019-08-09 DIAGNOSIS — I2510 Atherosclerotic heart disease of native coronary artery without angina pectoris: Secondary | ICD-10-CM | POA: Diagnosis not present

## 2019-08-09 DIAGNOSIS — Z681 Body mass index (BMI) 19 or less, adult: Secondary | ICD-10-CM | POA: Diagnosis not present

## 2019-08-09 DIAGNOSIS — E43 Unspecified severe protein-calorie malnutrition: Secondary | ICD-10-CM | POA: Diagnosis not present

## 2019-08-12 ENCOUNTER — Other Ambulatory Visit: Payer: Self-pay

## 2019-08-12 NOTE — Patient Outreach (Signed)
[facility]) Care Management  08/12/2019  Maria Garrett [birth year] [id number]     Transition of Care Referral  Referral Date: 08/12/2019 Referral Source: [facility] Discharge Report Date of Discharge: 08/09/2019 Facility: [facility] Insurance: [facility]    Outreach attempt # 1 to patient. Spoke with spouse. He reports that patient was discharged from the hospital and sent to rehab facility. He is unsure of name. He reports he does not know how long patient will be there. Spouse shares how difficult it has been to have spouse be hospitalized and sent to rehab and him not be able to visit due to COVID restrictions. Support given to spouse.     Plan: RN CM will close case at this time.    [name], RN,BSN,CCM [facility] Care Management Telephonic Care Management Coordinator Direct Phone: [phone] Toll Free: [phone] Fax: [phone]

## 2019-08-13 DIAGNOSIS — J449 Chronic obstructive pulmonary disease, unspecified: Secondary | ICD-10-CM | POA: Diagnosis not present

## 2019-08-13 DIAGNOSIS — I10 Essential (primary) hypertension: Secondary | ICD-10-CM | POA: Diagnosis not present

## 2019-08-13 DIAGNOSIS — S42001D Fracture of unspecified part of right clavicle, subsequent encounter for fracture with routine healing: Secondary | ICD-10-CM | POA: Diagnosis not present

## 2019-08-13 DIAGNOSIS — S72144D Nondisplaced intertrochanteric fracture of right femur, subsequent encounter for closed fracture with routine healing: Secondary | ICD-10-CM | POA: Diagnosis not present

## 2019-08-19 DIAGNOSIS — S72031A Displaced midcervical fracture of right femur, initial encounter for closed fracture: Secondary | ICD-10-CM | POA: Diagnosis not present

## 2019-08-19 DIAGNOSIS — S42001A Fracture of unspecified part of right clavicle, initial encounter for closed fracture: Secondary | ICD-10-CM | POA: Diagnosis not present

## 2019-08-29 DIAGNOSIS — I10 Essential (primary) hypertension: Secondary | ICD-10-CM | POA: Diagnosis not present

## 2019-08-29 DIAGNOSIS — N3000 Acute cystitis without hematuria: Secondary | ICD-10-CM | POA: Diagnosis not present

## 2019-08-29 DIAGNOSIS — G309 Alzheimer's disease, unspecified: Secondary | ICD-10-CM | POA: Diagnosis not present

## 2019-08-29 DIAGNOSIS — I480 Paroxysmal atrial fibrillation: Secondary | ICD-10-CM | POA: Diagnosis not present

## 2019-09-05 DIAGNOSIS — G309 Alzheimer's disease, unspecified: Secondary | ICD-10-CM | POA: Diagnosis not present

## 2019-09-05 DIAGNOSIS — J449 Chronic obstructive pulmonary disease, unspecified: Secondary | ICD-10-CM | POA: Diagnosis not present

## 2019-09-05 DIAGNOSIS — S72144D Nondisplaced intertrochanteric fracture of right femur, subsequent encounter for closed fracture with routine healing: Secondary | ICD-10-CM | POA: Diagnosis not present

## 2019-09-05 DIAGNOSIS — S42001D Fracture of unspecified part of right clavicle, subsequent encounter for fracture with routine healing: Secondary | ICD-10-CM | POA: Diagnosis not present

## 2019-09-16 DIAGNOSIS — S42021A Displaced fracture of shaft of right clavicle, initial encounter for closed fracture: Secondary | ICD-10-CM | POA: Diagnosis not present

## 2019-09-16 DIAGNOSIS — S72001A Fracture of unspecified part of neck of right femur, initial encounter for closed fracture: Secondary | ICD-10-CM | POA: Diagnosis not present

## 2019-09-17 ENCOUNTER — Other Ambulatory Visit: Payer: Self-pay

## 2019-09-17 DIAGNOSIS — D62 Acute posthemorrhagic anemia: Secondary | ICD-10-CM | POA: Diagnosis not present

## 2019-09-17 DIAGNOSIS — N3000 Acute cystitis without hematuria: Secondary | ICD-10-CM | POA: Diagnosis not present

## 2019-09-17 DIAGNOSIS — S72144D Nondisplaced intertrochanteric fracture of right femur, subsequent encounter for closed fracture with routine healing: Secondary | ICD-10-CM | POA: Diagnosis not present

## 2019-09-17 DIAGNOSIS — G309 Alzheimer's disease, unspecified: Secondary | ICD-10-CM | POA: Diagnosis not present

## 2019-09-17 NOTE — Patient Outreach (Signed)
[facility]) Care Management  09/17/2019  Maria Garrett [birth year] [id number]     Transition of Care Referral  Referral Date: 09/16/29 Referral Source: [facility] Discharge Report Date of Admission: Diagnosis: Date of Discharge: 09/15/19 Facility: [facility] Insurance: [facility]   Referral received. Transition of care calls being completed via EMMI-automated calls. RN CM will outreach patient for any red flags received.    Plan: RN CM will close case at this time.    [name], RN,BSN,CCM [facility] Care Management Telephonic Care Management Coordinator Direct Phone: [phone] Toll Free: [phone] Fax: [phone]

## 2019-11-19 DEATH — deceased

## 2022-08-31 NOTE — Telephone Encounter (Signed)
done
